# Patient Record
Sex: Female | Born: 1955
Health system: Southern US, Community
[De-identification: ages and names within clinical notes are randomized; demographics above are authoritative.]

## PROBLEM LIST (undated history)

## (undated) DIAGNOSIS — M1990 Unspecified osteoarthritis, unspecified site: Secondary | ICD-10-CM

## (undated) DIAGNOSIS — F419 Anxiety disorder, unspecified: Secondary | ICD-10-CM

## (undated) DIAGNOSIS — Z9889 Other specified postprocedural states: Secondary | ICD-10-CM

## (undated) DIAGNOSIS — M810 Age-related osteoporosis without current pathological fracture: Secondary | ICD-10-CM

## (undated) DIAGNOSIS — R112 Nausea with vomiting, unspecified: Secondary | ICD-10-CM

## (undated) DIAGNOSIS — K219 Gastro-esophageal reflux disease without esophagitis: Secondary | ICD-10-CM

## (undated) DIAGNOSIS — K589 Irritable bowel syndrome without diarrhea: Secondary | ICD-10-CM

## (undated) HISTORY — DX: Unspecified osteoarthritis, unspecified site: M19.90

## (undated) HISTORY — DX: Irritable bowel syndrome, unspecified: K58.9

## (undated) HISTORY — DX: Age-related osteoporosis without current pathological fracture: M81.0

## (undated) HISTORY — PX: BUNIONECTOMY: SHX129

---

## 2008-04-04 ENCOUNTER — Ambulatory Visit: Payer: Self-pay | Admitting: Family Medicine

## 2008-10-28 ENCOUNTER — Ambulatory Visit: Payer: Self-pay | Admitting: Family Medicine

## 2009-05-08 ENCOUNTER — Ambulatory Visit: Payer: Self-pay | Admitting: Gastroenterology

## 2011-01-10 ENCOUNTER — Ambulatory Visit: Payer: Self-pay | Admitting: Podiatry

## 2011-09-03 ENCOUNTER — Ambulatory Visit: Payer: Self-pay | Admitting: Internal Medicine

## 2011-09-18 ENCOUNTER — Ambulatory Visit: Payer: Self-pay | Admitting: Internal Medicine

## 2011-10-17 ENCOUNTER — Ambulatory Visit: Payer: Self-pay | Admitting: Internal Medicine

## 2012-01-30 ENCOUNTER — Ambulatory Visit: Payer: Self-pay | Admitting: Podiatry

## 2014-07-20 ENCOUNTER — Encounter: Payer: Self-pay | Admitting: Family Medicine

## 2014-07-20 ENCOUNTER — Ambulatory Visit (INDEPENDENT_AMBULATORY_CARE_PROVIDER_SITE_OTHER): Payer: BC Managed Care – PPO | Admitting: Family Medicine

## 2014-07-20 VITALS — BP 118/70 | HR 66 | Temp 98.2°F | Resp 16 | Ht 62.25 in | Wt 102.4 lb

## 2014-07-20 DIAGNOSIS — R634 Abnormal weight loss: Secondary | ICD-10-CM

## 2014-07-20 DIAGNOSIS — H6120 Impacted cerumen, unspecified ear: Secondary | ICD-10-CM

## 2014-07-20 DIAGNOSIS — H6121 Impacted cerumen, right ear: Secondary | ICD-10-CM

## 2014-07-20 DIAGNOSIS — R1013 Epigastric pain: Secondary | ICD-10-CM

## 2014-07-20 DIAGNOSIS — K5900 Constipation, unspecified: Secondary | ICD-10-CM

## 2014-07-20 DIAGNOSIS — R110 Nausea: Secondary | ICD-10-CM

## 2014-07-20 LAB — CBC WITH DIFFERENTIAL/PLATELET
BASOS ABS: 0 10*3/uL (ref 0.0–0.1)
BASOS PCT: 0 % (ref 0–1)
EOS ABS: 0.1 10*3/uL (ref 0.0–0.7)
Eosinophils Relative: 1 % (ref 0–5)
HCT: 40.2 % (ref 36.0–46.0)
HEMOGLOBIN: 13.9 g/dL (ref 12.0–15.0)
Lymphocytes Relative: 21 % (ref 12–46)
Lymphs Abs: 1.6 10*3/uL (ref 0.7–4.0)
MCH: 31 pg (ref 26.0–34.0)
MCHC: 34.6 g/dL (ref 30.0–36.0)
MCV: 89.7 fL (ref 78.0–100.0)
MONOS PCT: 11 % (ref 3–12)
Monocytes Absolute: 0.8 10*3/uL (ref 0.1–1.0)
Neutro Abs: 5.2 10*3/uL (ref 1.7–7.7)
Neutrophils Relative %: 67 % (ref 43–77)
Platelets: 555 10*3/uL — ABNORMAL HIGH (ref 150–400)
RBC: 4.48 MIL/uL (ref 3.87–5.11)
RDW: 13.9 % (ref 11.5–15.5)
WBC: 7.7 10*3/uL (ref 4.0–10.5)

## 2014-07-20 LAB — POCT UA - MICROSCOPIC ONLY
Casts, Ur, LPF, POC: NEGATIVE
Crystals, Ur, HPF, POC: NEGATIVE
Epithelial cells, urine per micros: NEGATIVE
MUCUS UA: NEGATIVE
WBC, UR, HPF, POC: NEGATIVE
Yeast, UA: NEGATIVE

## 2014-07-20 LAB — POCT URINALYSIS DIPSTICK
BILIRUBIN UA: NEGATIVE
Glucose, UA: NEGATIVE
KETONES UA: NEGATIVE
Nitrite, UA: NEGATIVE
Protein, UA: 30
Spec Grav, UA: 1.015
Urobilinogen, UA: 0.2
pH, UA: 6

## 2014-07-20 MED ORDER — PANTOPRAZOLE SODIUM 40 MG PO TBEC: 40.0000 mg | DELAYED_RELEASE_TABLET | Freq: Every day | ORAL | Status: DC

## 2014-07-20 NOTE — Progress Notes (Signed)
Subjective:    Patient ID: Mary Kirk, female    DOB: August 16, 1956, 58 y.o.   MRN: [id number]  HPI Chief Complaint  Patient presents with  . Establish Care   This chart was scribed for Ethelda Chick, MD by Andrew Au, ED Scribe. This patient was seen in room 21 and the patient's care was started at 3:00 PM.  HPI Comments: Mary Kirk is a 58 y.o. female who presents to the [facility] to establish care. This is a former pt of Dr. Katrinka Blazing when she practiced in [place]. Pt is currently not working.  She c/o gradual intermittent nausea onset 2 months. She reports nausea is worse in the morning after eating. Pt reports running after eating in the morning and that nausea is subsided after exercise. She denies nausea while running. She reports she has a foul smell in ear that she suspects was an inner ear infection. She correlates this with nausea. She reports she washed ear with syringe. She denies nausea in the past 2 days. She report weight loss. She has a restricted diet due to trouble with constipation in the past which has improved. She reports she takes miralax as needed. She reports she does not eat carbs and eats foods with high fiber content. Pt's last colonoscopy was about 6 years ago which was normal. She reports a bitter taste in mouth at times in the morning. She denies emesis and abdominal pain. She denies CP.   She reports worsening right groin pain. She reports this worsens with constipation. Pt has had a pelvic ultrasound in the past for this problem but found nothing wrong with ovary.    Pt c/o arthritis. She reports achiness and pain in bilateral hands. She report she takes tylenol for this.  History reviewed. No pertinent past medical history. History reviewed. No pertinent past surgical history. Prior to Admission medications   Medication Sig Start Date End Date Taking? Authorizing Provider  OVER THE COUNTER MEDICATION daily.   Yes Historical  Provider, MD   History   Social History  . Marital Status: Married    Spouse Name: N/A    Number of Children: N/A  . Years of Education: N/A   Occupational History  . Not on file.   Social History Main Topics  . Smoking status: Never Smoker   . Smokeless tobacco: Not on file  . Alcohol Use: Yes     Comment: wine  . Drug Use: No  . Sexual Activity: Not on file   Other Topics Concern  . Not on file   Social History Narrative  . No narrative on file   No family history on file.   Review of Systems  Constitutional: Positive for activity change, appetite change and unexpected weight change. Negative for fever and chills.  Respiratory: Negative for cough and shortness of breath.   Cardiovascular: Negative for chest pain, palpitations and leg swelling.  Gastrointestinal: Positive for nausea and constipation. Negative for vomiting, abdominal pain, diarrhea, blood in stool, abdominal distention and anal bleeding.  Genitourinary: Positive for pelvic pain. Negative for dysuria, urgency and hematuria.  Skin: Negative for rash.  Psychiatric/Behavioral: Negative for dysphoric mood. The patient is not nervous/anxious.    Objective:   Physical Exam  Nursing note and vitals reviewed. Constitutional: She is oriented to person, place, and time. She appears well-developed and well-nourished. No distress.  HENT:  Head: Normocephalic and atraumatic.  Left Ear: External ear normal.  Nose: Nose  normal.  Mouth/Throat: Oropharynx is clear and moist.  Cerumen impaction in right ear.   Eyes: Conjunctivae and EOM are normal. Pupils are equal, round, and reactive to light.  Neck: Neck supple. No thyromegaly present.  Cardiovascular: Normal rate, regular rhythm and normal heart sounds.  Exam reveals no gallop and no friction rub.   No murmur heard. Pulmonary/Chest: Effort normal and breath sounds normal. No respiratory distress. She has no wheezes. She has no rales. She exhibits no tenderness.    Abdominal: Soft. Bowel sounds are normal. She exhibits no distension and no mass. There is no hepatosplenomegaly. There is tenderness ( mild) in the epigastric area. There is no rebound, no guarding and no CVA tenderness. No hernia.  Musculoskeletal: Normal range of motion.  Lymphadenopathy:    She has no cervical adenopathy.  Neurological: She is alert and oriented to person, place, and time.  Skin: Skin is warm and dry.  Psychiatric: She has a normal mood and affect. Her behavior is normal.   Filed Vitals:   07/20/14 1428  BP: 118/70  Pulse: 66  Temp: 98.2 F (36.8 C)  TempSrc: Oral  Resp: 16  Height: 5' 2.25" (1.581 m)  Weight: 102 lb 6.4 oz (46.448 kg)  SpO2: 99%   Assessment & Plan:   1. Nausea alone   2. Unspecified constipation   3. Abdominal pain, epigastric   4. Loss of weight   5. Cerumen impaction, right    1. Nausea:  New.  In early mornings; obtain labs. Treat with PPI for 1-3 months.  If persists after one month of PPI, refer to GI for EGD.   2. Constipation: worsening with change in activity; continue Miralax daily PRN; continue with exercise. 3.  Abdominal pain epigastric:  New. Associated with nausea. Ddx includes gastritis, PUD, pancreatic process; obtain labs. If no improvement in six weeks with PPI, refer to GI. 4.  Loss of weight:  New.  Associated with nausea, epigastric pain. If worsens, obtain CT abd/pelvis to evaluate for pancreatic cancer or intraabdominal process.   Refer to GI for EGD. 5. Cerumen impaction R: New.    I personally performed the services described in this documentation, which was scribed in my presence.  The recorded information has been reviewed and is accurate.  Nilda SimmerKristi Vittorio Mohs, M.D.  [facility]  [place] [address] [place], [place]  [id number] [phone] phone [phone] fax

## 2014-07-21 ENCOUNTER — Telehealth: Payer: Self-pay

## 2014-07-21 LAB — COMPREHENSIVE METABOLIC PANEL
ALK PHOS: 41 U/L (ref 39–117)
ALT: 31 U/L (ref 0–35)
AST: 25 U/L (ref 0–37)
Albumin: 4.4 g/dL (ref 3.5–5.2)
BILIRUBIN TOTAL: 0.7 mg/dL (ref 0.2–1.2)
BUN: 12 mg/dL (ref 6–23)
CO2: 27 mEq/L (ref 19–32)
Calcium: 9.7 mg/dL (ref 8.4–10.5)
Chloride: 101 mEq/L (ref 96–112)
Creat: 0.8 mg/dL (ref 0.50–1.10)
GLUCOSE: 74 mg/dL (ref 70–99)
Potassium: 5.1 mEq/L (ref 3.5–5.3)
SODIUM: 137 meq/L (ref 135–145)
Total Protein: 6.7 g/dL (ref 6.0–8.3)

## 2014-07-21 LAB — AMYLASE: Amylase: 121 U/L — ABNORMAL HIGH (ref 0–105)

## 2014-07-21 LAB — LIPASE: Lipase: 25 U/L (ref 0–75)

## 2014-07-21 LAB — TSH: TSH: 1.163 u[IU]/mL (ref 0.350–4.500)

## 2014-07-21 NOTE — Telephone Encounter (Signed)
SMITH - Pt says the medicine she was prescribed yesterday could lead to low B-12.  She already has low B-12 and takes a supplement.  Should she take this medicine?  [phone]

## 2014-07-21 NOTE — Telephone Encounter (Signed)
She was prescribed Protonix 40mg . Please advise.

## 2014-07-25 ENCOUNTER — Telehealth: Payer: Self-pay

## 2014-07-25 NOTE — Telephone Encounter (Signed)
Pt has stopped the Pantoprozole.  Please advise GI consult. I have pended order.

## 2014-07-25 NOTE — Telephone Encounter (Signed)
Patient says she is having side effects to medication prescribed for her stomach issue, she now wants to see [facility] if there are no other medications that she can take.  She says the medications prescribed makes her heart race and makes her dizzy, so she will no longer be taking it.

## 2014-07-26 MED ORDER — RANITIDINE HCL 150 MG PO TABS: 150.0000 mg | ORAL_TABLET | Freq: Two times a day (BID) | ORAL | Status: DC

## 2014-07-26 NOTE — Telephone Encounter (Signed)
Call back --- 1. Protonix/pantoprazole can lead to low b12 levels if taken long-term (i.e. For several months or years).  I do not plan on patient taking for several months; thus, I am not concerned about it causing a worsening B12 deficiency. Pt also complained of side effects to medication; thus, I have sent a new rx for Ranitidine bid to her pharmacy to start taking.

## 2014-07-26 NOTE — Telephone Encounter (Signed)
Pt has been advised. She will monitor for any side effects and call us with any concerns.

## 2014-07-26 NOTE — Telephone Encounter (Signed)
Call --- 1.  I would like to try a different medication for patient.  I have sent in Ranitidine 150mg  one tablet twice daily to pharmacy.

## 2014-07-26 NOTE — Telephone Encounter (Signed)
Advised pt- ok on holding off on the GI referral.

## 2014-08-02 ENCOUNTER — Encounter: Payer: Self-pay | Admitting: Family Medicine

## 2014-08-17 ENCOUNTER — Telehealth: Payer: Self-pay

## 2014-08-17 MED ORDER — ONDANSETRON HCL 8 MG PO TABS: 8.0000 mg | ORAL_TABLET | Freq: Three times a day (TID) | ORAL | Status: DC | PRN

## 2014-08-17 NOTE — Telephone Encounter (Signed)
Please advise on nausea medication.

## 2014-08-17 NOTE — Telephone Encounter (Signed)
Patient requesting medication to be called in to [facility] pharmacy [phone] for nausea. Per patient it is not coming from illness and the nausea comes and goes. Patient has an appointment with Dr Katrinka Blazing on sept 16 @ 10:15. Patients call back number is [phone]

## 2014-08-17 NOTE — Telephone Encounter (Signed)
Call --- rx for Zofran sent to pharmacy.

## 2014-08-18 NOTE — Telephone Encounter (Signed)
Pt advised.

## 2014-08-31 ENCOUNTER — Encounter: Payer: Self-pay | Admitting: Family Medicine

## 2014-08-31 ENCOUNTER — Ambulatory Visit (INDEPENDENT_AMBULATORY_CARE_PROVIDER_SITE_OTHER): Payer: BC Managed Care – PPO | Admitting: Family Medicine

## 2014-08-31 ENCOUNTER — Other Ambulatory Visit: Payer: Self-pay | Admitting: Family Medicine

## 2014-08-31 VITALS — BP 129/76 | HR 67 | Temp 98.1°F | Resp 16 | Ht 64.5 in | Wt 101.0 lb

## 2014-08-31 DIAGNOSIS — R1013 Epigastric pain: Secondary | ICD-10-CM

## 2014-08-31 DIAGNOSIS — Z23 Encounter for immunization: Secondary | ICD-10-CM

## 2014-08-31 DIAGNOSIS — R110 Nausea: Secondary | ICD-10-CM

## 2014-08-31 DIAGNOSIS — R634 Abnormal weight loss: Secondary | ICD-10-CM

## 2014-08-31 DIAGNOSIS — F411 Generalized anxiety disorder: Secondary | ICD-10-CM

## 2014-08-31 DIAGNOSIS — R748 Abnormal levels of other serum enzymes: Secondary | ICD-10-CM

## 2014-08-31 LAB — COMPLETE METABOLIC PANEL WITH GFR
ALBUMIN: 4.5 g/dL (ref 3.5–5.2)
ALT: 40 U/L — AB (ref 0–35)
AST: 33 U/L (ref 0–37)
Alkaline Phosphatase: 47 U/L (ref 39–117)
BUN: 13 mg/dL (ref 6–23)
CHLORIDE: 99 meq/L (ref 96–112)
CO2: 28 meq/L (ref 19–32)
Calcium: 9.8 mg/dL (ref 8.4–10.5)
Creat: 0.78 mg/dL (ref 0.50–1.10)
GFR, EST NON AFRICAN AMERICAN: 84 mL/min
GLUCOSE: 94 mg/dL (ref 70–99)
POTASSIUM: 4.9 meq/L (ref 3.5–5.3)
SODIUM: 134 meq/L — AB (ref 135–145)
TOTAL PROTEIN: 6.8 g/dL (ref 6.0–8.3)
Total Bilirubin: 0.8 mg/dL (ref 0.2–1.2)

## 2014-08-31 LAB — AMYLASE: AMYLASE: 154 U/L — AB (ref 0–105)

## 2014-08-31 LAB — LIPASE: Lipase: 36 U/L (ref 0–75)

## 2014-08-31 NOTE — Patient Instructions (Signed)
1.  Call if your nausea recurs. 2. Also call if you feel that your anxiety is worsening.

## 2014-08-31 NOTE — Progress Notes (Signed)
Subjective:    Patient ID: Mary Kirk, female    DOB: [birth year], 58 y.o.   MRN: [id number]  This chart was scribed for Mary Chick, MD by Tonye Royalty, ED Scribe. This patient was seen in room 22 and the patient's care was started at 10:33 AM.   08/31/2014  follow up visit, Nausea, Abdominal Pain and Weight Loss   HPI HPI Comments: Mary Kirk is a 58 y.o. female who presents to the [facility] for 6 week follow up for nausea, epigastric pain, and weight loss. Labs were obtained at her last visit, her CBC was normal except for platelets that were slightly elevated which is a chronic issue, sugar was normal, liver and kidney functions were normal, thyroid was normal, lipase was normal but had slightly elevated amylase and I recommended repeating that at this visit. I prescribed Protonix and she suffered some side effects; she was worried about worsening B12 deficiency if she continued, so I switched her to Zantac. She called 2 weeks ago requesting medication for nausea which she did not want at the initial visit so I sent Zofran. Her weight is down 1 lb since 6 weeks ago.  She states her symptoms are intermittent and are better some days and worse some days; she states she has been well for the past 6 days. Before that, she was on vacation in [place]. She reports using Zantac twice a day for a month without noticeably improved symptoms. She states she has not used the Zofran. She states symptoms seemed to be worse after eating breakfast, usually eggs and toast, but that has not been consistent lately. She reports running but states symptoms do not seem to be significantly correlated. She states symptoms seem to occur when she is busy and she reports a history of undiagnosed anxiety. She states her anxiety seems worse since she has not been working and reports recent stress [place]. She denies anxiety ever having caused similar symptoms before.  She states she drinks less coffee now since she stopped working. She reports drinking ginger tea without remission of symptoms. She reports in the past having some nausea with tiredness with associated neck tightness and reports nausea eases after massaging her neck. She reports chronic constipation. She reports moderate alcohol use and family history of alcoholism but states she is mindful of her alcohol intake.  She drinks one glass of wine per week night and 2-3 glasses per weekend night.   Review of Systems  Constitutional: Positive for fatigue and unexpected weight change (loss). Negative for fever, chills and diaphoresis.  Respiratory: Negative for shortness of breath.   Cardiovascular: Negative for palpitations and leg swelling.  Gastrointestinal: Positive for nausea and constipation (chronic). Negative for abdominal pain, diarrhea, blood in stool, abdominal distention, anal bleeding and rectal pain.       Abdominal discomfort  Endocrine: Negative for cold intolerance, heat intolerance, polydipsia, polyphagia and polyuria.  Psychiatric/Behavioral: The patient is nervous/anxious.        Anxiety    History reviewed. No pertinent past medical history. History reviewed. No pertinent past surgical history. Allergies  Allergen Reactions  . Codeine Nausea Only   Current Outpatient Prescriptions  Medication Sig Dispense Refill  . OVER THE COUNTER MEDICATION daily.      . ondansetron (ZOFRAN) 8 MG tablet Take 1 tablet (8 mg total) by mouth every 8 (eight) hours as needed for nausea or vomiting.  20 tablet  0  . pantoprazole (PROTONIX) 40 MG tablet Take 1 tablet (40 mg total) by mouth daily.  30 tablet  3  . ranitidine (ZANTAC) 150 MG tablet Take 1 tablet (150 mg total) by mouth 2 (two) times daily.  60 tablet  3   No current facility-administered medications for this visit.       Objective:    BP 129/76  Pulse 67  Temp(Src) 98.1 F (36.7 C) (Oral)  Resp 16  Ht 5' 4.5" (1.638 m)   Wt 101 lb (45.813 kg)  BMI 17.08 kg/m2  SpO2 99% Physical Exam  Nursing note and vitals reviewed. Constitutional: She is oriented to person, place, and time. She appears well-developed and well-nourished. No distress.  HENT:  Head: Normocephalic and atraumatic.  Right Ear: External ear normal.  Left Ear: External ear normal.  Nose: Nose normal.  Mouth/Throat: Oropharynx is clear and moist.  Eyes: Conjunctivae and EOM are normal. Pupils are equal, round, and reactive to light.  Neck: Normal range of motion. Neck supple. Carotid bruit is not present. No thyromegaly present.  Cardiovascular: Normal rate, regular rhythm, normal heart sounds and intact distal pulses.  Exam reveals no gallop and no friction rub.   No murmur heard. Pulmonary/Chest: Effort normal and breath sounds normal. No respiratory distress. She has no wheezes. She has no rales.  Abdominal: Soft. Bowel sounds are normal. She exhibits no distension and no mass. There is tenderness (epigastric). There is no rebound and no guarding.  Musculoskeletal: Normal range of motion.  Lymphadenopathy:    She has no cervical adenopathy.  Neurological: She is alert and oriented to person, place, and time. No cranial nerve deficit.  Skin: Skin is warm and dry. No rash noted. She is not diaphoretic. No erythema. No pallor.  Psychiatric: She has a normal mood and affect. Her behavior is normal.   Results for orders placed in visit on 08/31/14  LIPASE      Result Value Ref Range   Lipase 36  0 - 75 U/L  AMYLASE      Result Value Ref Range   Amylase 154 (*) 0 - 105 U/L  COMPLETE METABOLIC PANEL WITH GFR      Result Value Ref Range   Sodium 134 (*) 135 - 145 mEq/L   Potassium 4.9  3.5 - 5.3 mEq/L   Chloride 99  96 - 112 mEq/L   CO2 28  19 - 32 mEq/L   Glucose, Bld 94  70 - 99 mg/dL   BUN 13  6 - 23 mg/dL   Creat 1.61  0.96 - 0.45 mg/dL   Total Bilirubin 0.8  0.2 - 1.2 mg/dL   Alkaline Phosphatase 47  39 - 117 U/L   AST 33  0 - 37  U/L   ALT 40 (*) 0 - 35 U/L   Total Protein 6.8  6.0 - 8.3 g/dL   Albumin 4.5  3.5 - 5.2 g/dL   Calcium 9.8  8.4 - 40.9 mg/dL   GFR, Est African American >89     GFR, Est Non African American 84     INFLUENZA VACCINE ADMINISTERED.    Assessment & Plan:   1. Nausea alone   2. Abdominal pain, epigastric   3. Elevated amylase   4. Anxiety state, unspecified   5. Need for prophylactic vaccination and inoculation against influenza   6. Loss of weight     1. Nausea: intermittent but persistent; improvement in past week.  No improvement with  Zantac bid for one month. I recurs, refer for CT abd/pelvis and for GI consultation.   2.  Abdominal pain epigastric: persistent but mild; associated with nausea; if persists, refer to GI. 3  Elevated amylase:  New at last visit; repeat today and remains elevated; warrants CT abd/pelvis and GI consultation. 4.  Anxiety: worsening lately due to transition to not working and moving.  Pt reluctant to try PRN anxiety medication or daily anxiety medication; if persists or worsens, agreeable to trial of medication.  Very reluctant to take habit forming benzo. 5.  S/p flu vaccine. 6. Weight loss: stable; weight down only one pound in six weeks; if worsens, will warrant appetite stimulant and further work up.   No orders of the defined types were placed in this encounter.    No Follow-up on file.   I personally performed the services described in this documentation, which was scribed in my presence.  The recorded information has been reviewed and is accurate.   Nilda Simmer, M.D.  [facility] [address] [place], [place]  [address] [phone] phone [phone] fax

## 2014-09-06 NOTE — Addendum Note (Signed)
Addended by: Ethelda Chick on: 09/06/2014 03:14 PM   Modules accepted: Orders

## 2014-09-07 ENCOUNTER — Encounter: Payer: Self-pay | Admitting: Family Medicine

## 2014-09-07 LAB — HEPATITIS PANEL, ACUTE
HCV Ab: NEGATIVE
HEP B C IGM: NONREACTIVE
Hep A IgM: NONREACTIVE
Hepatitis B Surface Ag: NEGATIVE

## 2014-09-14 ENCOUNTER — Telehealth: Payer: Self-pay

## 2014-09-15 ENCOUNTER — Ambulatory Visit: Payer: Self-pay | Admitting: Family Medicine

## 2014-09-22 ENCOUNTER — Encounter: Payer: Self-pay | Admitting: Family Medicine

## 2014-09-22 ENCOUNTER — Telehealth: Payer: Self-pay | Admitting: Family Medicine

## 2014-09-22 DIAGNOSIS — K862 Cyst of pancreas: Secondary | ICD-10-CM

## 2014-09-22 NOTE — Telephone Encounter (Signed)
Spoke with patient --- advised of CT abdomen and pelvis results:  6 mm low density lesion within pancreatic tail which is likely cystic but cannot rule out indolent cystic neoplasm.  Recommend MRI without and with contrast in one year; however, I advised obtaining MRI pancreas in six months to patient.  I will also fax to Dr. [name] Wohl in [place].  Pt expressed understanding.

## 2014-09-22 NOTE — Telephone Encounter (Signed)
Patient called requesting CT results. Report is in your box

## 2014-09-23 ENCOUNTER — Encounter: Payer: Self-pay | Admitting: Family Medicine

## 2014-10-11 ENCOUNTER — Encounter: Payer: Self-pay | Admitting: Family Medicine

## 2014-12-26 ENCOUNTER — Ambulatory Visit (INDEPENDENT_AMBULATORY_CARE_PROVIDER_SITE_OTHER): Payer: BLUE CROSS/BLUE SHIELD | Admitting: Family Medicine

## 2014-12-26 ENCOUNTER — Encounter: Payer: Self-pay | Admitting: Family Medicine

## 2014-12-26 VITALS — BP 124/70 | HR 68 | Temp 97.9°F | Resp 16 | Ht 63.75 in | Wt 103.0 lb

## 2014-12-26 DIAGNOSIS — S39012A Strain of muscle, fascia and tendon of lower back, initial encounter: Secondary | ICD-10-CM

## 2014-12-26 MED ORDER — CYCLOBENZAPRINE HCL 10 MG PO TABS: ORAL_TABLET | ORAL | Status: DC

## 2014-12-26 NOTE — Progress Notes (Signed)
   Subjective:    Patient ID: Mary Kirk, female    DOB: 10/23/1956, 59 y.o.   MRN: [id number]  HPI Patient presents today with 3 day history of back pain. She has had a sensation of her back "going out," in the past. This usually occurs every couple of years and resolves within a day or two with acetaminophen.  Three days ago, she moved a couple of chairs and noticed pain in her low back, worse on the right side. She has had more "grabbing" pain with this episode. She felt better yesterday, but today has more pain. Worse after sitting or lying down for long periods of time. Slept poorly the first night and has slept better the last two nights.  Has taken arthritis strength tylenol without good relief. Using Icy hot patches with some relief.   She has not fallen. She has not had loss of bowel/bladder. No numbness, no tingling, no weakness.   She typically runs for exercise. Does not stretch often or do core strengthening exercises. Has not run since her back started hurting.   Review of Systems  Constitutional: Negative for fever and chills.  Respiratory: Negative for cough and shortness of breath.   Cardiovascular: Negative for chest pain.  Genitourinary: Negative for difficulty urinating.  Musculoskeletal: Positive for back pain. Negative for gait problem, neck pain and neck stiffness.  Neurological: Negative for dizziness, weakness, numbness and headaches.      Objective:   Physical Exam  Constitutional: She is oriented to person, place, and time. She appears well-developed and well-nourished.  HENT:  Head: Normocephalic and atraumatic.  Eyes: Conjunctivae are normal. Pupils are equal, round, and reactive to light.  Neck: Normal range of motion. Neck supple.  Cardiovascular: Normal rate, regular rhythm and normal heart sounds.   Pulmonary/Chest: Effort normal and breath sounds normal.  Musculoskeletal: Normal range of motion. She exhibits no edema or tenderness.       Lumbar  back: She exhibits pain. She exhibits normal range of motion and no bony tenderness. Tenderness: slightly tender right of midline.  Neurological: She is alert and oriented to person, place, and time. She has normal reflexes.  Skin: Skin is warm and dry.  Psychiatric: She has a normal mood and affect. Her behavior is normal. Judgment and thought content normal.  Vitals reviewed. BP 124/70 mmHg  Pulse 68  Temp(Src) 97.9 F (36.6 C) (Oral)  Resp 16  Ht 5' 3.75" (1.619 m)  Wt 103 lb (46.72 kg)  BMI 17.82 kg/m2  SpO2 96%     Assessment & Plan:  1. Low back strain, initial encounter - cyclobenzaprine (FLEXERIL) 10 MG tablet; Take 1/2- 1 tablet every 12 hours as needed for muscle spasm. Do not drive or operate heavy machinery while taking.  Dispense: 20 tablet; Refill: 0 - try otc NSAID for next several days for pain - Back Manual given to patient and she was encouraged to incorporate stretching and core strengthening into her regimen.  -RTC if no improvement in 5-7 days, sooner if worsening symptoms or if weakness, numbness/tingling, falls, loss of bowel/bladder function.   Emi Belfasteborah B. Coran Dipaola, FNP-BC  [facility], [facility]  12/27/2014 1:04 PM

## 2014-12-26 NOTE — Patient Instructions (Signed)
Try ibuprofen/advil 2-3 tablets every 8 hours for inflammation and pain OR naprosyn/alleve 1-2 tablets every 12 hours Return to clinic if pain worsens, or you have numbness/tingling/weakness in arms or legs.

## 2015-03-27 ENCOUNTER — Ambulatory Visit
Admit: 2015-03-27 | Disposition: A | Discharge status: Home / Self Care | Payer: Self-pay | Attending: Gastroenterology | Admitting: Gastroenterology

## 2015-05-22 ENCOUNTER — Encounter: Payer: Self-pay | Admitting: *Deleted

## 2015-11-15 ENCOUNTER — Ambulatory Visit (INDEPENDENT_AMBULATORY_CARE_PROVIDER_SITE_OTHER): Payer: BLUE CROSS/BLUE SHIELD | Admitting: Family Medicine

## 2015-11-15 VITALS — BP 110/70 | HR 64 | Temp 98.4°F | Resp 18 | Ht 65.0 in | Wt 105.3 lb

## 2015-11-15 DIAGNOSIS — K5901 Slow transit constipation: Secondary | ICD-10-CM

## 2015-11-15 DIAGNOSIS — Z23 Encounter for immunization: Secondary | ICD-10-CM

## 2015-11-15 MED ORDER — LUBIPROSTONE 8 MCG PO CAPS: 8.0000 ug | ORAL_CAPSULE | Freq: Two times a day (BID) | ORAL | Status: DC

## 2015-11-15 NOTE — Patient Instructions (Signed)
Options: 1. Miralax once daily to twice daily.  2.  The week prior to a trip, increase Miralax to twice daily and also start Peri-colace 1-2 tablets twice daily during trip.  Option 2: 1. Amitiza twice daily.   Lubiprostone oral capsule What is this medicine? LUBIPROSTONE (loo bi PROS tone) is a laxative. It is used to treat chronic constipation and constipation caused by opioids (certain prescription pain medicines). It is also used to treat adult women with irritable bowel syndrome who have constipation. This medicine may be used for other purposes; ask your health care provider or pharmacist if you have questions. What should I tell my health care provider before I take this medicine? They need to know if you have any of these conditions: -cancer or tumor in abdomen, intestine, or stomach -history of bowel obstruction or adhesions -history of stool (fecal) impaction -liver disease -an unusual or allergic reaction to lubiprostone, other medicines, foods, dyes, or preservatives -pregnant or trying to get pregnant -breast-feeding How should I use this medicine? Take this medicine by mouth with a glass of water. Follow the directions on the prescription label. Do not cut, crush or chew this medicine. Take this medicine with food. Take your medicine at regular intervals. Do not take your medicine more often than directed. Do not stop taking except on your doctor's advice. Talk to your pediatrician regarding the use of this medicine in children. Special care may be needed. Overdosage: If you think you have taken too much of this medicine contact a poison control center or emergency room at once. NOTE: This medicine is only for you. Do not share this medicine with others. What if I miss a dose? If you miss a dose, take it as soon as you can. If it is almost time for your next dose, take only that dose. Do not take double or extra doses. What may interact with this medicine? -medicines that  treat diarrhea -methadone -other medicines for constipation This list may not describe all possible interactions. Give your health care provider a list of all the medicines, herbs, non-prescription drugs, or dietary supplements you use. Also tell them if you smoke, drink alcohol, or use illegal drugs. Some items may interact with your medicine. What should I watch for while using this medicine? Visit your doctor for regular check ups. Tell your doctor if your symptoms do not get better or if they get worse. What side effects may I notice from receiving this medicine? Side effects that you should report to your doctor or health care professional as soon as possible: -allergic reactions like skin rash, itching or hives, swelling of the face, lips, or tongue -new or worsening stomach pain -severe or prolonged diarrhea Side effects that usually do not require medical attention (report to your prescriber or health care professional if they continue or are bothersome): -headache -loose stools -nausea This list may not describe all possible side effects. Call your doctor for medical advice about side effects. You may report side effects to FDA at 1-800-FDA-1088. Where should I keep my medicine? Keep out of the reach of children. Store at room temperature between 15 and 30 degrees C (59 and 86 degrees F). Throw away any unused medicine after the expiration date. NOTE: This sheet is a summary. It may not cover all possible information. If you have questions about this medicine, talk to your doctor, pharmacist, or health care provider.    2016, Elsevier/Gold Standard. (2012-04-10 09:24:31)

## 2015-11-15 NOTE — Progress Notes (Signed)
Subjective:    Patient ID: Mary Kirk, female    DOB: 01/30/1956, 59 y.o.   MRN: [id number]  11/15/2015  Constipation   HPI This 59 y.o. female presents for evaluation of constipation.  Chronic issue for patient.  As long as patient runs, stools are regular. Now, exercising is not helping as much as previous.  Now if misses one day of running, will not have b.m.  Most recently went on vacation in October; avoided cheese and potatoes.  Riding cauess constipation; drank excessive water.  Took Miralax due to traveling.  Still didn't have b.m. During vacation.  Just went this weekend to mountains.  Did not skip running on trip.  Progressively worsening.  Goes to dinner and has vegetables with meat.  Still not enough.  This time, took Dulcolax but it worked.  Tries not to take laxatives; had not taken in a long time.  Sensation that had to go.  Used maximum dose of Dolcolax.  Then has diarrhea.  Disrupted day.  Runs to avoid this.  No previous diagnosis of IBS but has always suffered with constipation.  In past, will have constipation alternating with diarrhea.  Discussed with Dr. Servando SnareWohl; no recent issues in 6-8 months.  Has never took 1.5 doses of Miralax one day.  RLQ pain intermittently; if gets constipated, it gets worse.  [facility]   Did take Miralax daily with good results in past.  Has been taking Magnesium Citrate 200mg  once daily; now not helping.     Review of Systems  Constitutional: Negative for fever, chills, diaphoresis and fatigue.  Eyes: Negative for visual disturbance.  Respiratory: Negative for cough and shortness of breath.   Cardiovascular: Negative for chest pain, palpitations and leg swelling.  Gastrointestinal: Positive for nausea and constipation. Negative for vomiting, abdominal pain, diarrhea, blood in stool, abdominal distention, anal bleeding and rectal pain.  Endocrine: Negative for cold intolerance, heat intolerance, polydipsia, polyphagia and polyuria.   Genitourinary: Negative for dysuria, urgency, hematuria and flank pain.  Neurological: Negative for dizziness, tremors, seizures, syncope, facial asymmetry, speech difficulty, weakness, light-headedness, numbness and headaches.    History reviewed. No pertinent past medical history. History reviewed. No pertinent past surgical history. Allergies  Allergen Reactions  . Codeine Nausea Only   Current Outpatient Prescriptions  Medication Sig Dispense Refill  . cyclobenzaprine (FLEXERIL) 10 MG tablet Take 1/2- 1 tablet every 12 hours as needed for muscle spasm. Do not drive or operate heavy machinery while taking. (Patient not taking: Reported on 11/15/2015) 20 tablet 0  . lubiprostone (AMITIZA) 8 MCG capsule Take 1 capsule (8 mcg total) by mouth 2 (two) times daily with a meal. 60 capsule 5  . OVER THE COUNTER MEDICATION daily.    . pantoprazole (PROTONIX) 40 MG tablet Take 1 tablet (40 mg total) by mouth daily. (Patient not taking: Reported on 11/15/2015) 30 tablet 3  . ranitidine (ZANTAC) 150 MG tablet Take 1 tablet (150 mg total) by mouth 2 (two) times daily. (Patient not taking: Reported on 11/15/2015) 60 tablet 3   No current facility-administered medications for this visit.   Social History   Social History  . Marital Status: Married    Spouse Name: N/A  . Number of Children: N/A  . Years of Education: N/A   Occupational History  . unemployed     previous bank work   Social History Main Topics  . Smoking status: Never Smoker   . Smokeless tobacco: Never Used  .  Alcohol Use: 6.0 oz/week    10 Glasses of wine per week     Comment: wine  . Drug Use: No  . Sexual Activity: Yes   Other Topics Concern  . Not on file   Social History Narrative   Marital status: married     Children: yes      Lives: with husband      Employment: unemployed; previous bank work.      Tobacco; never      Alcohol: 1-2 glasses of wine per night      Exercise: jogging.   Family History    Problem Relation Age of Onset  . Diabetes Mother   . Heart disease Mother   . Arthritis Father   . Hyperlipidemia Father   . Hypertension Father   . Stroke Father 74    CVA at age 61; recurrent CVA age 71.       Objective:    BP 110/70 mmHg  Pulse 64  Temp(Src) 98.4 F (36.9 C) (Oral)  Resp 18  Ht  (1.651 m)  Wt 105 lb 5.4 oz (47.782 kg)  BMI 17.53 kg/m2  SpO2 98% Physical Exam  Constitutional: She is oriented to person, place, and time. She appears well-developed and well-nourished. No distress.  HENT:  Head: Normocephalic and atraumatic.  Nose: Nose normal.  Mouth/Throat: Oropharynx is clear and moist.  Eyes: Conjunctivae and EOM are normal. Pupils are equal, round, and reactive to light.  Neck: Normal range of motion. Neck supple. Carotid bruit is not present. No thyromegaly present.  Cardiovascular: Normal rate, regular rhythm, normal heart sounds and intact distal pulses.  Exam reveals no gallop and no friction rub.   No murmur heard. Pulmonary/Chest: Effort normal and breath sounds normal. She has no wheezes. She has no rales.  Abdominal: Soft. Bowel sounds are normal. She exhibits no distension and no mass. There is no tenderness. There is no rebound and no guarding.  Lymphadenopathy:    She has no cervical adenopathy.  Neurological: She is alert and oriented to person, place, and time. No cranial nerve deficit.  Skin: Skin is warm and dry. No rash noted. She is not diaphoretic. No erythema. No pallor.  Psychiatric: She has a normal mood and affect. Her behavior is normal.        Assessment & Plan:   1. Slow transit constipation   2. Need for prophylactic vaccination and inoculation against influenza    1.  Constipation chronic:  Recurrent; recommend trial of daily Miralax once daily to twice daily; prior to trips, recommend increasing Miralax to bid for one week prior and then adding Pericolace 1-2 tablets bid during trip. If no improvement with this  regimen, recommend trial of Amitiza  bid; rx provided.  If no improvement with Amitiza  bid, consider increase to  Amitiza bid. Colonoscopy UTD. 2.  S/p influenza vaccine.   Orders Placed This Encounter  Procedures  . Flu Vaccine QUAD 36+ mos IM   Meds ordered this encounter  Medications  . lubiprostone (AMITIZA) 8 MCG capsule    Sig: Take 1 capsule (8 mcg total) by mouth 2 (two) times daily with a meal.    Dispense:  60 capsule    Refill:  5    No Follow-up on file.    Joaovictor Krone Paulita Fujita, M.D. [facility] [address] [place], [place]  [address] [phone] phone [phone] fax

## 2015-12-13 ENCOUNTER — Encounter: Payer: Self-pay | Admitting: Family Medicine

## 2015-12-13 MED ORDER — LUBIPROSTONE 24 MCG PO CAPS: 24.0000 ug | ORAL_CAPSULE | Freq: Two times a day (BID) | ORAL | Status: DC

## 2016-04-01 ENCOUNTER — Ambulatory Visit (INDEPENDENT_AMBULATORY_CARE_PROVIDER_SITE_OTHER): Payer: BLUE CROSS/BLUE SHIELD | Admitting: Family Medicine

## 2016-04-01 VITALS — BP 112/78 | HR 71 | Temp 98.0°F | Resp 18 | Ht 65.0 in | Wt 104.6 lb

## 2016-04-01 DIAGNOSIS — K5901 Slow transit constipation: Secondary | ICD-10-CM

## 2016-04-01 DIAGNOSIS — L821 Other seborrheic keratosis: Secondary | ICD-10-CM

## 2016-04-01 DIAGNOSIS — B029 Zoster without complications: Secondary | ICD-10-CM

## 2016-04-01 MED ORDER — GABAPENTIN 100 MG PO CAPS: 100.0000 mg | ORAL_CAPSULE | Freq: Three times a day (TID) | ORAL | Status: DC

## 2016-04-01 MED ORDER — VALACYCLOVIR HCL 1 G PO TABS: 1000.0000 mg | ORAL_TABLET | Freq: Three times a day (TID) | ORAL | Status: DC

## 2016-04-01 NOTE — Patient Instructions (Addendum)
1. Lidocaine/lidoderm patches --- you can purchase over the counter and apply to area where pain is located.     IF you received an x-ray today, you will receive an invoice from [facility]. Please contact [facility] at [phone] with questions or concerns regarding your invoice.   IF you received labwork today, you will receive an invoice from [employer]. Please contact [facility] at [phone] with questions or concerns regarding your invoice.   Our billing staff will not be able to assist you with questions regarding bills from these companies.  You will be contacted with the lab results as soon as they are available. The fastest way to get your results is to activate your My Chart account. Instructions are located on the last page of this paperwork. If you have not heard from [place] regarding the results in 2 weeks, please contact this office.     Shingles Shingles, which is also known as herpes zoster, is an infection that causes a painful skin rash and fluid-filled blisters. Shingles is not related to genital herpes, which is a sexually transmitted infection.   Shingles only develops in people who:  Have had chickenpox.  Have received the chickenpox vaccine. (This is rare.) CAUSES Shingles is caused by varicella-zoster virus (VZV). This is the same virus that causes chickenpox. After exposure to VZV, the virus stays in the body in an inactive (dormant) state. Shingles develops if the virus reactivates. This can happen many years after the initial exposure to VZV. It is not known what causes this virus to reactivate. RISK FACTORS People who have had chickenpox or received the chickenpox vaccine are at risk for shingles. Infection is more common in people who:  Are older than age 60.  Have a weakened defense (immune) system, such as those with HIV, AIDS, or cancer.  Are taking medicines that weaken the immune system, such as transplant  medicines.  Are under great stress. SYMPTOMS Early symptoms of this condition include itching, tingling, and pain in an area on your skin. Pain may be described as burning, stabbing, or throbbing. A few days or weeks after symptoms start, a painful red rash appears, usually on one side of the body in a bandlike or beltlike pattern. The rash eventually turns into fluid-filled blisters that break open, scab over, and dry up in about 2-3 weeks. At any time during the infection, you may also develop:  A fever.  Chills.  A headache.  An upset stomach. DIAGNOSIS This condition is diagnosed with a skin exam. Sometimes, skin or fluid samples are taken from the blisters before a diagnosis is made. These samples are examined under a microscope or sent to a lab for testing. TREATMENT There is no specific cure for this condition. Your health care provider will probably prescribe medicines to help you manage pain, recover more quickly, and avoid long-term problems. Medicines may include:  Antiviral drugs.  Anti-inflammatory drugs.  Pain medicines. If the area involved is on your face, you may be referred to a specialist, such as an eye doctor (ophthalmologist) or an ear, nose, and throat (ENT) doctor to help you avoid eye problems, chronic pain, or disability. HOME CARE INSTRUCTIONS Medicines  Take medicines only as directed by your health care provider.  Apply an anti-itch or numbing cream to the affected area as directed by your health care provider. Blister and Rash Care  Take a cool bath or apply cool compresses to the area of the rash or blisters as  directed by your health care provider. This may help with pain and itching.  Keep your rash covered with a loose bandage (dressing). Wear loose-fitting clothing to help ease the pain of material rubbing against the rash.  Keep your rash and blisters clean with mild soap and cool water or as directed by your health care provider.  Check  your rash every day for signs of infection. These include redness, swelling, and pain that lasts or increases.  Do not pick your blisters.  Do not scratch your rash. General Instructions  Rest as directed by your health care provider.  Keep all follow-up visits as directed by your health care provider. This is important.  Until your blisters scab over, your infection can cause chickenpox in people who have never had it or been vaccinated against it. To prevent this from happening, avoid contact with other people, especially:  Babies.  Pregnant women.  Children who have eczema.  Elderly people who have transplants.  People who have chronic illnesses, such as leukemia or AIDS. SEEK MEDICAL CARE IF:  Your pain is not relieved with prescribed medicines.  Your pain does not get better after the rash heals.  Your rash looks infected. Signs of infection include redness, swelling, and pain that lasts or increases. SEEK IMMEDIATE MEDICAL CARE IF:  The rash is on your face or nose.  You have facial pain, pain around your eye area, or loss of feeling on one side of your face.  You have ear pain or you have ringing in your ear.  You have loss of taste.  Your condition gets worse.   This information is not intended to replace advice given to you by your health care provider. Make sure you discuss any questions you have with your health care provider.   Document Released: 12/02/2005 Document Revised: 12/23/2014 Document Reviewed: 10/13/2014 Elsevier Interactive Patient Education [employer]2016 Elsevier Inc.

## 2016-04-13 ENCOUNTER — Encounter: Payer: Self-pay | Admitting: Family Medicine

## 2016-04-13 NOTE — Progress Notes (Signed)
Subjective:    Patient ID: Mary Kirk, female    DOB: [birth year], 60 y.o.   MRN: [id number]  04/01/2016  Rash and Mole   HPI This 60 y.o. female presents for evaluation of rash on L buttocks; onset two days ago. Noticed significant pain and tingling at site initially; then noticed rash.  Concerned about shingles.  Also has a few moles that wants evaluated; followed closely by dermatology; will be due to see dermatology in upcoming 3 months.  Constipation: stable; will utilize Miralax with good results.   Review of Systems  Constitutional: Negative for fever, chills, diaphoresis and fatigue.  Musculoskeletal: Positive for myalgias.  Skin: Positive for rash.  Neurological: Positive for numbness.    Past Medical History  Diagnosis Date  . IBS (irritable bowel syndrome)     Constipation predominant   History reviewed. No pertinent past surgical history. Allergies  Allergen Reactions  . Codeine Nausea Only    Social History   Social History  . Marital Status: Married    Spouse Name: N/A  . Number of Children: N/A  . Years of Education: N/A   Occupational History  . unemployed     previous bank work   Social History Main Topics  . Smoking status: Never Smoker   . Smokeless tobacco: Never Used  . Alcohol Use: 6.0 oz/week    10 Glasses of wine per week     Comment: wine  . Drug Use: No  . Sexual Activity: Yes   Other Topics Concern  . Not on file   Social History Narrative   Marital status: married     Children: yes      Lives: with husband      Employment: unemployed; previous bank work.      Tobacco; never      Alcohol: 1-2 glasses of wine per night      Exercise: jogging.   Family History  Problem Relation Age of Onset  . Diabetes Mother   . Heart disease Mother   . Arthritis Father   . Hyperlipidemia Father   . Hypertension Father   . Stroke Father 67    CVA at age 61; recurrent CVA age 39.       Objective:    BP 112/78 mmHg  Pulse  71  Temp(Src) 98 F (36.7 C) (Oral)  Resp 18  Ht  (1.651 m)  Wt 104 lb 9.6 oz (47.446 kg)  BMI 17.41 kg/m2  SpO2 98% Physical Exam  Constitutional: She is oriented to person, place, and time. She appears well-developed and well-nourished. No distress.  HENT:  Head: Normocephalic and atraumatic.  Eyes: Conjunctivae are normal. Pupils are equal, round, and reactive to light.  Neck: Normal range of motion. Neck supple.  Cardiovascular: Normal rate, regular rhythm and normal heart sounds.  Exam reveals no gallop and no friction rub.   No murmur heard. Pulmonary/Chest: Effort normal and breath sounds normal. She has no wheezes. She has no rales.  Neurological: She is alert and oriented to person, place, and time.  Skin: Rash noted. She is not diaphoretic.     Vesicular rash L buttocks region in clusters.  Forearm with hyperpigmented skin lesion without color variation or irregular borders.  Psychiatric: She has a normal mood and affect. Her behavior is normal.  Nursing note and vitals reviewed.       Assessment & Plan:   1. Herpes zoster   2. Slow transit constipation   3. Seborrheic  keratoses    -New; rx for Valtrex and Neurontin. -recommend dermatology evaluation for full skin survey. -constipation well controlled with Miralax.   No orders of the defined types were placed in this encounter.   Meds ordered this encounter  Medications  . valACYclovir (VALTREX) 1000 MG tablet    Sig: Take 1 tablet (1,000 mg total) by mouth 3 (three) times daily.    Dispense:  30 tablet    Refill:  0  . gabapentin (NEURONTIN) 100 MG capsule    Sig: Take 1-2 capsules (100-200 mg total) by mouth 3 (three) times daily.    Dispense:  60 capsule    Refill:  0    No Follow-up on file.    Deavon Podgorski Paulita FujitaMartin Emmilynn Marut, M.D. [facility]  [place] [address] [place], [place]  [id number] [phone] phone [phone] fax

## 2016-06-06 DIAGNOSIS — C4441 Basal cell carcinoma of skin of scalp and neck: Secondary | ICD-10-CM | POA: Diagnosis not present

## 2016-06-06 DIAGNOSIS — L570 Actinic keratosis: Secondary | ICD-10-CM | POA: Diagnosis not present

## 2016-06-06 DIAGNOSIS — D485 Neoplasm of uncertain behavior of skin: Secondary | ICD-10-CM | POA: Diagnosis not present

## 2016-06-06 DIAGNOSIS — L820 Inflamed seborrheic keratosis: Secondary | ICD-10-CM | POA: Diagnosis not present

## 2016-06-06 DIAGNOSIS — L821 Other seborrheic keratosis: Secondary | ICD-10-CM | POA: Diagnosis not present

## 2016-07-30 DIAGNOSIS — L578 Other skin changes due to chronic exposure to nonionizing radiation: Secondary | ICD-10-CM | POA: Diagnosis not present

## 2016-07-30 DIAGNOSIS — C44319 Basal cell carcinoma of skin of other parts of face: Secondary | ICD-10-CM | POA: Diagnosis not present

## 2016-07-30 DIAGNOSIS — L908 Other atrophic disorders of skin: Secondary | ICD-10-CM | POA: Diagnosis not present

## 2016-09-10 ENCOUNTER — Ambulatory Visit (INDEPENDENT_AMBULATORY_CARE_PROVIDER_SITE_OTHER): Payer: BLUE CROSS/BLUE SHIELD | Admitting: Family Medicine

## 2016-09-10 DIAGNOSIS — Z23 Encounter for immunization: Secondary | ICD-10-CM

## 2016-09-10 NOTE — Progress Notes (Signed)
Patient here for  Flu shot

## 2016-10-31 DIAGNOSIS — Z85828 Personal history of other malignant neoplasm of skin: Secondary | ICD-10-CM | POA: Diagnosis not present

## 2017-01-11 ENCOUNTER — Ambulatory Visit (INDEPENDENT_AMBULATORY_CARE_PROVIDER_SITE_OTHER): Payer: BLUE CROSS/BLUE SHIELD

## 2017-01-11 ENCOUNTER — Ambulatory Visit (HOSPITAL_BASED_OUTPATIENT_CLINIC_OR_DEPARTMENT_OTHER)
Admission: RE | Admit: 2017-01-11 | Discharge: 2017-01-11 | Disposition: A | Discharge status: Home / Self Care | Payer: BLUE CROSS/BLUE SHIELD | Source: Ambulatory Visit | Attending: Urgent Care | Admitting: Urgent Care

## 2017-01-11 ENCOUNTER — Ambulatory Visit (INDEPENDENT_AMBULATORY_CARE_PROVIDER_SITE_OTHER): Payer: BLUE CROSS/BLUE SHIELD | Admitting: Urgent Care

## 2017-01-11 VITALS — BP 110/68 | HR 78 | Temp 98.0°F | Resp 18 | Ht 65.0 in | Wt 107.0 lb

## 2017-01-11 DIAGNOSIS — R937 Abnormal findings on diagnostic imaging of other parts of musculoskeletal system: Secondary | ICD-10-CM | POA: Diagnosis not present

## 2017-01-11 DIAGNOSIS — R1032 Left lower quadrant pain: Secondary | ICD-10-CM | POA: Diagnosis not present

## 2017-01-11 DIAGNOSIS — W19XXXA Unspecified fall, initial encounter: Secondary | ICD-10-CM | POA: Insufficient documentation

## 2017-01-11 DIAGNOSIS — M25552 Pain in left hip: Secondary | ICD-10-CM | POA: Diagnosis not present

## 2017-01-11 DIAGNOSIS — S79912A Unspecified injury of left hip, initial encounter: Secondary | ICD-10-CM | POA: Diagnosis not present

## 2017-01-11 MED ORDER — NAPROXEN SODIUM 550 MG PO TABS: 550.0000 mg | ORAL_TABLET | Freq: Two times a day (BID) | ORAL | 1 refills | Status: DC

## 2017-01-11 NOTE — Patient Instructions (Addendum)
GO TO [facility] [address], [place], [place] [address]  GO TO ER AND TELL THEM YOU ARE HERE TO HAVE A CT SCAN OUTPATIENT AND THEY WILL COME AND GET YOU.    IF you received an x-ray today, you will receive an invoice from [facility]. Please contact [facility] at [phone] with questions or concerns regarding your invoice.   IF you received labwork today, you will receive an invoice from [facility]. Please contact [facility] at [phone] with questions or concerns regarding your invoice.   Our billing staff will not be able to assist you with questions regarding bills from these companies.  You will be contacted with the lab results as soon as they are available. The fastest way to get your results is to activate your My Chart account. Instructions are located on the last page of this paperwork. If you have not heard from us regarding the results in 2 weeks, please contact this office.

## 2017-01-11 NOTE — Progress Notes (Signed)
CALLED TO PA THE CT SCAN AND THEY ARE CLOSED. L/M WITH ALL INFO TO GET PA AND CALL BACK WITH AUTH #

## 2017-01-11 NOTE — Progress Notes (Signed)
  MRN: [id number] DOB: 12/12/1956  Subjective:   Mary Kirk is a 61 y.o. female presenting for chief complaint of Groin Pain (PATIENT HAD A FALL WHEN SHE WAS SKIING)  Reports 5 day history of fall while skiing. Patient was getting off of the ski lift, got pushed over by the ski lift and fell, felt like she twisted her hip or leg. She went down the ski slope and started feeling worse. Over the next few days felt improvement using Alleve initially and then switched to Advil with continued improvement. However,  Since yesterday her left groin, inner thigh is now having sharp pain, can be aggravated by bending, flexing at the hip. Denies further trauma, shooting pain, low back pain, numbness or tingling, incontinence, bruising, swelling, laceration, weakness, hearing popping noises.  Mary Kirk has a current medication list which includes the following prescription(s): valacyclovir. Also is allergic to codeine.  Mary Kirk  has a past medical history of IBS (irritable bowel syndrome). Also  has no past surgical history on file.  Objective:   Vitals: BP 110/68 (BP Location: Right Arm, Patient Position: Sitting, Cuff Size: Small)   Pulse 78   Temp 98 F (36.7 C) (Oral)   Resp 18   Ht 5\' 5"  (1.651 m)   Wt 107 lb (48.5 kg)   SpO2 98%   BMI 17.81 kg/m   Physical Exam  Constitutional: She is oriented to person, place, and time. She appears well-developed and well-nourished.  Cardiovascular: Normal rate.   Pulmonary/Chest: Effort normal.  Musculoskeletal:       Left hip: She exhibits tenderness (focal tenderness over area depicted). She exhibits normal range of motion, normal strength, no bony tenderness, no swelling, no crepitus, no deformity and no laceration.       Legs: No associated ecchymosis.  Neurological: She is alert and oriented to person, place, and time. She displays normal reflexes. Coordination (limping, favoring left leg) abnormal.  Skin: Skin is warm and dry.   Dg Hip Unilat W Or  W/o Pelvis 2-3 Views Left  Result Date: 01/11/2017 CLINICAL DATA:  Fall while skiing a few days ago with persistent left hip and groin pain. EXAM: DG HIP (WITH OR WITHOUT PELVIS) 2-3V LEFT COMPARISON:  None. FINDINGS: There are mild symmetric degenerative changes of the hips. Subtle focal cortical regularity along the inferior cortex of the left superior pubic ramus as cannot exclude a fracture in this location. No evidence of femur fracture. Several pelvic phleboliths. IMPRESSION: Cannot exclude a fracture of the left superior pubic ramus. Recommend pelvic CT for further evaluation. Electronically Signed   By: Elberta Fortisaniel  Boyle M.D.   On: 01/11/2017 16:21   Assessment and Plan :   This case was precepted with Dr. Alvy BimlerSagardia.   1. Fall, initial encounter 2. Left groin pain 3. Abnormal x-ray of pelvis - Will pursue non-contrast CT of hip for further evaluation. Patient is to rest, use NSAID treatment in the meantime.   Wallis BambergMario Eulanda Dorion, PA-C [facility] [phone] 01/11/2017  3:52 PM

## 2017-01-14 ENCOUNTER — Encounter: Payer: Self-pay | Admitting: Family Medicine

## 2017-01-14 ENCOUNTER — Ambulatory Visit (INDEPENDENT_AMBULATORY_CARE_PROVIDER_SITE_OTHER): Payer: BLUE CROSS/BLUE SHIELD | Admitting: Family Medicine

## 2017-01-14 VITALS — BP 135/78 | HR 69 | Temp 98.1°F | Resp 16 | Ht 65.0 in | Wt 107.0 lb

## 2017-01-14 DIAGNOSIS — S73102D Unspecified sprain of left hip, subsequent encounter: Secondary | ICD-10-CM | POA: Diagnosis not present

## 2017-01-14 NOTE — Progress Notes (Signed)
Subjective:    Patient ID: Mary Kirk, female    DOB: 04/17/1956, 61 y.o.   MRN: [id number]  01/14/2017  Follow-up (goin injury/ pt had fallen/ and would like ro review x rays)   HPI This 61 y.o. female presents for evaluation of L groin pain.  [facility]; before, pain cocrrued with flexion of L hip. Now, pain is medial and  Medial side of leg.  With sitting down and bending over.  Pain has changed since onset.  Taking Aleve there.  Started taking Ibuprofen.  Started feeling better when ran errands.  Did run a lot of errands and got in and out of car a lot.  Started hurting between legs. Got car washed.  Thought seat bothered more.  Can hardly walk; must lean on things to relief to pressure with stepping down.  Gets better with Aleve.  Two days ago, really took it easy.     Review of Systems  Constitutional: Negative for chills, diaphoresis, fatigue and fever.  Eyes: Negative for visual disturbance.  Respiratory: Negative for cough and shortness of breath.   Cardiovascular: Negative for chest pain, palpitations and leg swelling.  Gastrointestinal: Negative for abdominal pain, constipation, diarrhea, nausea and vomiting.  Endocrine: Negative for cold intolerance, heat intolerance, polydipsia, polyphagia and polyuria.  Genitourinary: Negative for decreased urine volume, difficulty urinating, dyspareunia, dysuria, enuresis, flank pain, frequency, genital sores, hematuria, pelvic pain, urgency, vaginal bleeding, vaginal discharge and vaginal pain.  Musculoskeletal: Positive for arthralgias and gait problem.  Neurological: Negative for dizziness, tremors, seizures, syncope, facial asymmetry, speech difficulty, weakness, light-headedness, numbness and headaches.    Past Medical History:  Diagnosis Date  . IBS (irritable bowel syndrome)    Constipation predominant   No past surgical history on file. Allergies  Allergen Reactions  . Codeine Nausea Only    Current Outpatient Prescriptions  Medication Sig Dispense Refill  . naproxen sodium (ANAPROX DS) 550 MG tablet Take 1 tablet (550 mg total) by mouth 2 (two) times daily with a meal. 30 tablet 1   No current facility-administered medications for this visit.    Social History   Social History  . Marital status: Married    Spouse name: N/A  . Number of children: N/A  . Years of education: N/A   Occupational History  . unemployed [employer]    previous bank work   Social History Main Topics  . Smoking status: Never Smoker  . Smokeless tobacco: Never Used  . Alcohol use 6.0 oz/week    10 Glasses of wine per week     Comment: wine  . Drug use: No  . Sexual activity: Yes   Other Topics Concern  . Not on file   Social History Narrative   Marital status: married     Children: yes      Lives: with husband      Employment: unemployed; previous bank work.      Tobacco; never      Alcohol: 1-2 glasses of wine per night      Exercise: jogging.   Family History  Problem Relation Age of Onset  . Diabetes Mother   . Heart disease Mother   . Arthritis Father   . Hyperlipidemia Father   . Hypertension Father   . Stroke Father 1480    CVA at age 61; recurrent CVA age 61.       Objective:    BP 135/78   Pulse 69  Temp 98.1 F (36.7 C) (Oral)   Resp 16   Ht 5\' 5"  (1.651 m)   Wt 107 lb (48.5 kg)   SpO2 99%   BMI 17.81 kg/m  Physical Exam  Constitutional: She is oriented to person, place, and time. She appears well-developed and well-nourished. No distress.  HENT:  Head: Normocephalic and atraumatic.  Right Ear: External ear normal.  Left Ear: External ear normal.  Nose: Nose normal.  Mouth/Throat: Oropharynx is clear and moist.  Eyes: Conjunctivae and EOM are normal. Pupils are equal, round, and reactive to light.  Neck: Normal range of motion. Neck supple. Carotid bruit is not present. No thyromegaly present.  Cardiovascular: Normal rate, regular  rhythm, normal heart sounds and intact distal pulses.  Exam reveals no gallop and no friction rub.   No murmur heard. Pulmonary/Chest: Effort normal and breath sounds normal. She has no wheezes. She has no rales.  Abdominal: Soft. Bowel sounds are normal. She exhibits no distension and no mass. There is no tenderness. There is no rebound and no guarding.  Genitourinary:     Genitourinary Comments: +TTP along lateral proximal thigh; no swelling, induration, laceration, or erythema.  Musculoskeletal:       Right hip: Normal.       Left hip: She exhibits normal range of motion, normal strength, no tenderness, no bony tenderness and no swelling.       Lumbar back: She exhibits normal range of motion, no tenderness and no bony tenderness.       Left upper leg: She exhibits no tenderness, no bony tenderness and no swelling.  Pain in L hip/groin region with going from supine to sitting.  Walking with pain; flexion of L hip with pain reproduced.  Full internal and external rotation of L hip without pain; motor 5/5 L hip.    Lymphadenopathy:    She has no cervical adenopathy.  Neurological: She is alert and oriented to person, place, and time. No cranial nerve deficit.  Skin: Skin is warm and dry. No rash noted. She is not diaphoretic. No erythema. No pallor.  Psychiatric: She has a normal mood and affect. Her behavior is normal.   Ct Hip Left Wo Contrast  Result Date: 01/11/2017 CLINICAL DATA:  Left hip and groin pain after skiing injury Monday. EXAM: CT OF THE LEFT HIP WITHOUT CONTRAST TECHNIQUE: Multidetector CT imaging of the left hip was performed according to the standard protocol. Multiplanar CT image reconstructions were also generated. COMPARISON:  Left hip radiographs from 01/11/2017. FINDINGS: Bones/Joint/Cartilage No acute fracture of the left hip and adjacent pubic rami. The visualized left SI joint is intact. The bony sacrum demonstrates no acute fracture. Small phleboliths are seen in  the left hemipelvis. Ligaments Suboptimally assessed by CT. Muscles and Tendons No acute CT abnormality. Tiny ossification along the distal iliopsoas tendon near its insertion likely related to old remote trauma. Soft tissues Negative IMPRESSION: No acute osseous abnormality. Electronically Signed   By: Tollie Eth M.D.   On: 01/11/2017 17:42   Dg Hip Unilat W Or W/o Pelvis 2-3 Views Left  Result Date: 01/11/2017 CLINICAL DATA:  Fall while skiing a few days ago with persistent left hip and groin pain. EXAM: DG HIP (WITH OR WITHOUT PELVIS) 2-3V LEFT COMPARISON:  None. FINDINGS: There are mild symmetric degenerative changes of the hips. Subtle focal cortical regularity along the inferior cortex of the left superior pubic ramus as cannot exclude a fracture in this location. No evidence of femur fracture.  Several pelvic phleboliths. IMPRESSION: Cannot exclude a fracture of the left superior pubic ramus. Recommend pelvic CT for further evaluation. Electronically Signed   By: Elberta Fortis M.D.   On: 01/11/2017 16:21        Assessment & Plan:   1. Hip sprain, left, subsequent encounter    -persistent; reviewed recent imaging studies in detail during visit. -recommend ongoing rest, NSAIDS alternating with Tylenol, and ice versus heat. -if no improvement in upcoming week, call for ortho referral. -home exercise program provided to perform at home.  No orders of the defined types were placed in this encounter.  No orders of the defined types were placed in this encounter.   No Follow-up on file.   Jaykub Mackins Paulita Fujita, M.D. Primary Care at [facility] previously [facility] [address] [place], [place]  [address] [phone] phone [phone] fax

## 2017-01-14 NOTE — Patient Instructions (Addendum)
IF you received an x-ray today, you will receive an invoice from [facility]. Please contact [facility] at [phone] with questions or concerns regarding your invoice.   IF you received labwork today, you will receive an invoice from [facility]. Please contact [facility] at [phone] with questions or concerns regarding your invoice.   Our billing staff will not be able to assist you with questions regarding bills from these companies.  You will be contacted with the lab results as soon as they are available. The fastest way to get your results is to activate your My Chart account. Instructions are located on the last page of this paperwork. If you have not heard from [place] regarding the results in 2 weeks, please contact this office.      Hip Exercises Ask your health care provider which exercises are safe for you. Do exercises exactly as told by your health care provider and adjust them as directed. It is normal to feel mild stretching, pulling, tightness, or discomfort as you do these exercises, but you should stop right away if you feel sudden pain or your pain gets worse.Do not begin these exercises until told by your health care provider. STRETCHING AND RANGE OF MOTION EXERCISES  These exercises warm up your muscles and joints and improve the movement and flexibility of your hip. These exercises also help to relieve pain, numbness, and tingling. Exercise A: Hamstrings, Supine  1. Lie on your back. 2. Loop a belt or towel over the ball of your left / rightfoot. The ball of your foot is on the walking surface, right under your toes. 3. Straighten your left / rightknee and slowly pull on the belt to raise your leg.  Do not let your left / right knee bend while you do this.  Keep your other leg flat on the floor.  Raise the left / right leg until you feel a gentle stretch behind your left / right knee or thigh. 4. Hold this position for __________  seconds. 5. Slowly return your leg to the starting position. Repeat __________ times. Complete this stretch __________ times a day. Exercise B: Hip Rotators  1. Lie on your back on a firm surface. 2. Hold your left / right knee with your left / right hand. Hold your ankle with your other hand. 3. Gently pull your left / right knee and rotate your lower leg toward your other shoulder.  Pull until you feel a stretch in your buttocks.  Keep your hips and shoulders firmly planted while you do this stretch. 4. Hold this position for __________ seconds. Repeat __________ times. Complete this stretch __________ times a day. Exercise C: V-Sit (Hamstrings and Adductors)  1. Sit on the floor with your legs extended in a large "V" shape. Keep your knees straight during this exercise. 2. Start with your head and chest upright, then bend at your waist to reach for your left foot (position A). You should feel a stretch in your right inner thigh. 3. Hold this position for __________ seconds. Then slowly return to the upright position. 4. Bend at your waist to reach forward (position B). You should feel a stretch behind both of your thighs and knees. 5. Hold this position for __________ seconds. Then slowly return to the upright position. 6. Bend at your waist to reach for your right foot (position C). You should feel a stretch in your left inner thigh. 7. Hold this position for __________ seconds. Then slowly return to  the upright position. Repeat __________ times. Complete this stretch __________ times a day. Exercise D: Lunge (Hip Flexors)  1. Place your left / right knee on the floor and bend your other knee so that is directly over your ankle. You should be half-kneeling. 2. Keep good posture with your head over your shoulders. 3. Tighten your buttocks to point your tailbone downward. This helps your back to keep from arching too much. 4. You should feel a gentle stretch in the front of your left /  right thigh and hip. If you do not feel any resistance, slightly slide your other foot forward and then slowly lunge forward so your knee once again lines up over your ankle. 5. Make sure your tailbone continues to point downward. 6. Hold this position for __________ seconds. Repeat __________ times. Complete this stretch __________ times a day. STRENGTHENING EXERCISES  These exercises build strength and endurance in your hip. Endurance is the ability to use your muscles for a long time, even after they get tired. Exercise E: Bridge (Hip Extensors)  1. Lie on your back on a firm surface with your knees bent and your feet flat on the floor. 2. Tighten your buttocks muscles and lift your bottom off the floor until the trunk of your body is level with your thighs.  Do not arch your back.  You should feel the muscles working in your buttocks and the back of your thighs. If you do not feel these muscles, slide your feet 1-2 inches (2.5-5 cm) farther away from your buttocks. 3. Hold this position for __________ seconds. 4. Slowly lower your hips to the starting position. 5. Let your muscles relax completely between repetitions. 6. If this exercise is too easy, try doing it with your arms crossed over your chest. Repeat __________ times. Complete this exercise __________ times a day. Exercise F: Straight Leg Raises - Hip Abductors  1. Lie on your side with your left / right leg in the top position. Lie so your head, shoulder, knee, and hip line up with each other. You may bend your bottom knee to help you balance. 2. Roll your hips slightly forward, so your hips are stacked directly over each other and your left / right knee is facing forward. 3. Leading with your heel, lift your top leg 4-6 inches (10-15 cm). You should feel the muscles in your outer hip lifting.  Do not let your foot drift forward.  Do not let your knee roll toward the ceiling. 4. Hold this position for __________  seconds. 5. Slowly return to the starting position. 6. Let your muscles relax completely between repetitions. Repeat __________ times. Complete this exercise __________ times a day. Exercise G: Straight Leg Raises - Hip Adductors  1. Lie on your side with your left / right leg in the bottom position. Lie so your head, shoulder, knee, and hip line up. You may place your upper foot in front to help you balance. 2. Roll your hips slightly forward, so your hips are stacked directly over each other and your left / right knee is facing forward. 3. Tense the muscles in your inner thigh and lift your bottom leg 4-6 inches (10-15 cm). 4. Hold this position for __________ seconds. 5. Slowly return to the starting position. 6. Let your muscles relax completely between repetitions. Repeat __________ times. Complete this exercise __________ times a day. Exercise H: Straight Leg Raises - Quadriceps  1. Lie on your back with your left / right leg  extended and your other knee bent. 2. Tense the muscles in the front of your left / right thigh. When you do this, you should see your kneecap slide up or see increased dimpling just above your knee. 3. Tighten these muscles even more and raise your leg 4-6 inches (10-15 cm) off the floor. 4. Hold this position for __________ seconds. 5. Keep these muscles tense as you lower your leg. 6. Relax the muscles slowly and completely between repetitions. Repeat __________ times. Complete this exercise __________ times a day. Exercise I: Hip Abductors, Standing 1. Tie one end of a rubber exercise band or tubing to a secure surface, such as a table or pole. 2. Loop the other end of the band or tubing around your left / right ankle. 3. Keeping your ankle with the band or tubing directly opposite of the secured end, step away until there is tension in the tubing or band. Hold onto a chair as needed for balance. 4. Lift your left / right leg out to your side. While you do  this:  Keep your back upright.  Keep your shoulders over your hips.  Keep your toes pointing forward.  Make sure to use your hip muscles to lift your leg. Do not "throw" your leg or tip your body to lift your leg. 5. Hold this position for __________ seconds. 6. Slowly return to the starting position. Repeat __________ times. Complete this exercise __________ times a day. Exercise J: Squats (Quadriceps) 1. Stand in a door frame so your feet and knees are in line with the frame. You may place your hands on the frame for balance. 2. Slowly bend your knees and lower your hips like you are going to sit in a chair.  Keep your lower legs in a straight-up-and-down position.  Do not let your hips go lower than your knees.  Do not bend your knees lower than told by your health care provider.  If your hip pain increases, do not bend as low. 3. Hold this position for ___________ seconds. 4. Slowly push with your legs to return to standing. Do not use your hands to pull yourself to standing. Repeat __________ times. Complete this exercise __________ times a day. This information is not intended to replace advice given to you by your health care provider. Make sure you discuss any questions you have with your health care provider. Document Released: 12/20/2005 Document Revised: 08/26/2016 Document Reviewed: 11/27/2015 [employer] Interactive Patient Education  2017 [employer]

## 2017-02-19 ENCOUNTER — Encounter: Payer: Self-pay | Admitting: Family Medicine

## 2017-02-19 ENCOUNTER — Ambulatory Visit (INDEPENDENT_AMBULATORY_CARE_PROVIDER_SITE_OTHER): Payer: BLUE CROSS/BLUE SHIELD | Admitting: Family Medicine

## 2017-02-19 VITALS — BP 130/81 | HR 72 | Temp 98.2°F | Resp 16 | Ht 65.0 in | Wt 106.0 lb

## 2017-02-19 DIAGNOSIS — Z131 Encounter for screening for diabetes mellitus: Secondary | ICD-10-CM | POA: Diagnosis not present

## 2017-02-19 DIAGNOSIS — R636 Underweight: Secondary | ICD-10-CM

## 2017-02-19 DIAGNOSIS — Z136 Encounter for screening for cardiovascular disorders: Secondary | ICD-10-CM

## 2017-02-19 DIAGNOSIS — Z0000 Encounter for general adult medical examination without abnormal findings: Secondary | ICD-10-CM

## 2017-02-19 DIAGNOSIS — Z1231 Encounter for screening mammogram for malignant neoplasm of breast: Secondary | ICD-10-CM | POA: Diagnosis not present

## 2017-02-19 DIAGNOSIS — H6123 Impacted cerumen, bilateral: Secondary | ICD-10-CM

## 2017-02-19 DIAGNOSIS — Z13220 Encounter for screening for lipoid disorders: Secondary | ICD-10-CM

## 2017-02-19 DIAGNOSIS — Z124 Encounter for screening for malignant neoplasm of cervix: Secondary | ICD-10-CM | POA: Diagnosis not present

## 2017-02-19 DIAGNOSIS — Z1239 Encounter for other screening for malignant neoplasm of breast: Secondary | ICD-10-CM

## 2017-02-19 LAB — POCT URINALYSIS DIP (MANUAL ENTRY)
Bilirubin, UA: NEGATIVE
GLUCOSE UA: NEGATIVE
Ketones, POC UA: NEGATIVE
LEUKOCYTES UA: NEGATIVE
NITRITE UA: NEGATIVE
Protein Ur, POC: NEGATIVE
Spec Grav, UA: 1.015
UROBILINOGEN UA: 0.2
pH, UA: 8.5

## 2017-02-19 MED ORDER — ESTRADIOL 0.1 MG/GM VA CREA: 1.0000 | TOPICAL_CREAM | VAGINAL | 12 refills | Status: DC

## 2017-02-19 NOTE — Progress Notes (Signed)
Subjective:    Patient ID: Mary Kirk, female    DOB: [birth year], 61 y.o.   MRN: [id number]  02/19/2017  Annual Exam   HPI This 61 y.o. female presents Complete Physical Examination.  Last physical: several years ago Pap smear:  2009 Mammogram:  2009 Colonoscopy:  2010 Bone density:  never  Immunization History  Administered Date(s) Administered  . Influenza,inj,Quad PF,36+ Mos 08/31/2014, 11/15/2015, 09/10/2016   BP Readings from Last 3 Encounters:  02/19/17 130/81  01/14/17 135/78  01/11/17 110/68   Wt Readings from Last 3 Encounters:  02/19/17 106 lb (48.1 kg)  01/14/17 107 lb (48.5 kg)  01/11/17 107 lb (48.5 kg)      Review of Systems  Constitutional: Negative for activity change, appetite change, chills, diaphoresis, fatigue, fever and unexpected weight change.  HENT: Negative for congestion, dental problem, drooling, ear discharge, ear pain, facial swelling, hearing loss, mouth sores, nosebleeds, postnasal drip, rhinorrhea, sinus pressure, sneezing, sore throat, tinnitus, trouble swallowing and voice change.   Eyes: Negative for photophobia, pain, discharge, redness, itching and visual disturbance.  Respiratory: Negative for apnea, cough, choking, chest tightness, shortness of breath, wheezing and stridor.   Cardiovascular: Negative for chest pain, palpitations and leg swelling.  Gastrointestinal: Negative for abdominal distention, abdominal pain, anal bleeding, blood in stool, constipation, diarrhea, nausea, rectal pain and vomiting.  Endocrine: Negative for cold intolerance, heat intolerance, polydipsia, polyphagia and polyuria.  Genitourinary: Negative for decreased urine volume, difficulty urinating, dyspareunia, dysuria, enuresis, flank pain, frequency, genital sores, hematuria, menstrual problem, pelvic pain, urgency, vaginal bleeding, vaginal discharge and vaginal pain.  Musculoskeletal: Negative for arthralgias, back pain, gait problem, joint swelling,  myalgias, neck pain and neck stiffness.  Skin: Negative for color change, pallor, rash and wound.  Allergic/Immunologic: Negative for environmental allergies, food allergies and immunocompromised state.  Neurological: Negative for dizziness, tremors, seizures, syncope, facial asymmetry, speech difficulty, weakness, light-headedness, numbness and headaches.  Hematological: Negative for adenopathy. Does not bruise/bleed easily.  Psychiatric/Behavioral: Negative for agitation, behavioral problems, confusion, decreased concentration, dysphoric mood, hallucinations, self-injury, sleep disturbance and suicidal ideas. The patient is not nervous/anxious and is not hyperactive.     Past Medical History:  Diagnosis Date  . Arthritis   . IBS (irritable bowel syndrome)    Constipation predominant   Past Surgical History:  Procedure Laterality Date  . BUNIONECTOMY Bilateral    Allergies  Allergen Reactions  . Codeine Nausea Only   Current Outpatient Prescriptions  Medication Sig Dispense Refill  . acetaminophen (TYLENOL) 325 MG tablet Take 650 mg by mouth every 6 (six) hours as needed.    Marland Kitchen estradiol (ESTRACE) 0.1 MG/GM vaginal cream Place 1 Applicatorful vaginally 3 (three) times a week. 42.5 g 12   No current facility-administered medications for this visit.    Social History   Social History  . Marital status: Married    Spouse name: N/A  . Number of children: 1  . Years of education: N/A   Occupational History  . 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 Smoking status: Never Smoker  . Smokeless tobacco: Never Used  . Alcohol use 6.0 oz/week    10 Glasses of wine per week     Comment: wine  . Drug use: No  . Sexual activity: Yes   Other Topics Concern  . Not on file   Social History Narrative   Marital status: married x 42 years.  Children: 1 son; 2 grandchildren      Lives: with husband      Employment: unemployed;  previous bank work.      Tobacco; never      Alcohol: 1-2 glasses of wine per night      Exercise: jogging and walking in 2018      ADLs: independent      Family History  Problem Relation Age of Onset  . Diabetes Mother   . Heart disease Mother   . Arthritis Father   . Hyperlipidemia Father   . Hypertension Father   . Stroke Father 8080    CVA at age 61; recurrent CVA age 61.       Objective:    BP 130/81   Pulse 72   Temp 98.2 F (36.8 C) (Oral)   Resp 16   Ht 5\' 5"  (1.651 m)   Wt 106 lb (48.1 kg)   SpO2 99%   BMI 17.64 kg/m  Physical Exam  Constitutional: She is oriented to person, place, and time. She appears well-developed and well-nourished. No distress.  HENT:  Head: Normocephalic and atraumatic.  Right Ear: External ear normal.  Left Ear: External ear normal.  Nose: Nose normal.  Mouth/Throat: Oropharynx is clear and moist.  Cerumen impaction B ears.  Eyes: Conjunctivae and EOM are normal. Pupils are equal, round, and reactive to light.  Neck: Normal range of motion and full passive range of motion without pain. Neck supple. No JVD present. Carotid bruit is not present. No thyromegaly present.  Cardiovascular: Normal rate, regular rhythm and normal heart sounds.  Exam reveals no gallop and no friction rub.   No murmur heard. Pulmonary/Chest: Effort normal and breath sounds normal. She has no wheezes. She has no rales. Right breast exhibits no inverted nipple, no mass, no nipple discharge, no skin change and no tenderness. Left breast exhibits no inverted nipple, no mass, no nipple discharge, no skin change and no tenderness. Breasts are symmetrical.  Abdominal: Soft. Bowel sounds are normal. She exhibits no distension and no mass. There is no tenderness. There is no rebound and no guarding.  Genitourinary: Vagina normal and uterus normal. There is no rash, tenderness, lesion or injury on the right labia. There is no rash, tenderness, lesion or injury on the left  labia. Right adnexum displays no mass, no tenderness and no fullness. Left adnexum displays no mass, no tenderness and no fullness.  Musculoskeletal:       Right shoulder: Normal.       Left shoulder: Normal.       Cervical back: Normal.  Lymphadenopathy:    She has no cervical adenopathy.  Neurological: She is alert and oriented to person, place, and time. She has normal reflexes. No cranial nerve deficit. She exhibits normal muscle tone. Coordination normal.  Skin: Skin is warm and dry. No rash noted. She is not diaphoretic. No erythema. No pallor.  Psychiatric: She has a normal mood and affect. Her behavior is normal. Judgment and thought content normal.  Nursing note and vitals reviewed.  [facility] 01/14/2017 04/01/2016 11/15/2015 12/26/2014 07/20/2014  Decreased Interest 0 0 0 0 0  Down, Depressed, Hopeless 0 0 0 0 0  PHQ - 2 Score 0 0 0 0 0   Fall Risk  01/14/2017 01/11/2017 12/26/2014 07/20/2014  Falls in the past year? No Yes No No  Number falls in past yr: - 1 - -  Injury with Fall? - Yes - -  Assessment & Plan:   1. Routine physical examination   2. Cervical cancer screening   3. Breast cancer screening   4. Screening for cardiovascular condition   5. Underweight   6. Screening, lipid   7. Encounter for screening examination for impaired glucose regulation and diabetes mellitus   8. Bilateral impacted cerumen     Orders Placed This Encounter  Procedures  . MM SCREENING BREAST TOMO BILATERAL    Standing Status:   Future    Standing Expiration Date:   04/22/2018    Order Specific Question:   Reason for Exam (SYMPTOM  OR DIAGNOSIS REQUIRED)    Answer:   annual screening    Order Specific Question:   Is the patient pregnant?    Answer:   No    Order Specific Question:   Preferred imaging location?    Answer:   [facility]  . DG Bone Density    Standing Status:   Future    Standing Expiration Date:   04/21/2018    Order Specific Question:   Reason  for Exam (SYMPTOM  OR DIAGNOSIS REQUIRED)    Answer:   estrogen deficiency    Order Specific Question:   Is the patient pregnant?    Answer:   No    Order Specific Question:   Preferred imaging location?    Answer:   [facility]  . CBC with Differential/Platelet  . Comprehensive metabolic panel    Order Specific Question:   Has the patient fasted?    Answer:   Yes  . Lipid panel    Order Specific Question:   Has the patient fasted?    Answer:   Yes  . TSH  . VITAMIN D 25 Hydroxy (Vit-D Deficiency, Fractures)  . Hemoglobin A1c  . Ear wax removal  . POCT urinalysis dipstick  . EKG 12-Lead   Meds ordered this encounter  Medications  . acetaminophen (TYLENOL) 325 MG tablet    Sig: Take 650 mg by mouth every 6 (six) hours as needed.  Marland Kitchen estradiol (ESTRACE) 0.1 MG/GM vaginal cream    Sig: Place 1 Applicatorful vaginally 3 (three) times a week.    Dispense:  42.5 g    Refill:  12    Return in about 1 year (around 02/19/2018) for complete physical examiniation.   Dhiya Smits Paulita Fujita, M.D. Primary Care at [facility] previously [facility] [address] [place], [place]  [address] [phone] phone [phone] fax

## 2017-02-19 NOTE — Patient Instructions (Addendum)
   IF you received an x-ray today, you will receive an invoice from [facility]. Please contact  Radiology at [phone] with questions or concerns regarding your invoice.   IF you received labwork today, you will receive an invoice from [facility]. Please contact [facility] at [phone] with questions or concerns regarding your invoice.   Our billing staff will not be able to assist you with questions regarding bills from these companies.  You will be contacted with the lab results as soon as they are available. The fastest way to get your results is to activate your My Chart account. Instructions are located on the last page of this paperwork. If you have not heard from us regarding the results in 2 weeks, please contact this office.     Keeping You Healthy  Get These Tests  Blood Pressure- Have your blood pressure checked by your healthcare provider at least once a year.  Normal blood pressure is 120/80.  Weight- Have your body mass index (BMI) calculated to screen for obesity.  BMI is a measure of body fat based on height and weight.  You can calculate your own BMI at www.nhlbisupport.com/bmi/  Cholesterol- Have your cholesterol checked every year.  Diabetes- Have your blood sugar checked every year if you have high blood pressure, high cholesterol, a family history of diabetes or if you are overweight.  Pap Test - Have a pap test every 1 to 5 years if you have been sexually active.  If you are older than 65 and recent pap tests have been normal you may not need additional pap tests.  In addition, if you have had a hysterectomy  for benign disease additional pap tests are not necessary.  Mammogram-Yearly mammograms are essential for early detection of breast cancer  Screening for Colon Cancer- Colonoscopy starting at age 50. Screening may begin sooner depending on your family history and other health conditions.  Follow up colonoscopy as directed by your  Gastroenterologist.  Screening for Osteoporosis- Screening begins at age 65 with bone density scanning, sooner if you are at higher risk for developing Osteoporosis.  Get these medicines  Calcium with Vitamin D- Your body requires 1200-1500 mg of Calcium a day and 800-1000 IU of Vitamin D a day.  You can only absorb 500 mg of Calcium at a time therefore Calcium must be taken in 2 or 3 separate doses throughout the day.  Hormones- Hormone therapy has been associated with increased risk for certain cancers and heart disease.  Talk to your healthcare provider about if you need relief from menopausal symptoms.  Aspirin- Ask your healthcare provider about taking Aspirin to prevent Heart Disease and Stroke.  Get these Immuniztions  Flu shot- Every fall  Pneumonia shot- Once after the age of 65; if you are younger ask your healthcare provider if you need a pneumonia shot.  Tetanus- Every ten years.  Zostavax- Once after the age of 60 to prevent shingles.  Take these steps  Don't smoke- Your healthcare provider can help you quit. For tips on how to quit, ask your healthcare provider or go to www.smokefree.gov or call 1-800 QUIT-NOW.  Be physically active- Exercise 5 days a week for a minimum of 30 minutes.  If you are not already physically active, start slow and gradually work up to 30 minutes of moderate physical activity.  Try walking, dancing, bike riding, swimming, etc.  Eat a healthy diet- Eat a variety of healthy foods such as fruits, vegetables, whole grains, low   fat milk, low fat cheeses, yogurt, lean meats, chicken, fish, eggs, dried beans, tofu, etc.  For more information go to www.thenutritionsource.org  Dental visit- Brush and floss teeth twice daily; visit your dentist twice a year.  Eye exam- Visit your Optometrist or Ophthalmologist yearly.  Drink alcohol in moderation- Limit alcohol intake to one drink or less a day.  Never drink and drive.  Depression- Your emotional  health is as important as your physical health.  If you're feeling down or losing interest in things you normally enjoy, please talk to your healthcare provider.  Seat Belts- can save your life; always wear one  Smoke/Carbon Monoxide detectors- These detectors need to be installed on the appropriate level of your home.  Replace batteries at least once a year.  Violence- If anyone is threatening or hurting you, please tell your healthcare provider.  Living Will/ Health care power of attorney- Discuss with your healthcare provider and family.  

## 2017-02-20 LAB — CBC WITH DIFFERENTIAL/PLATELET
BASOS: 1 %
Basophils Absolute: 0.1 10*3/uL (ref 0.0–0.2)
EOS (ABSOLUTE): 0.2 10*3/uL (ref 0.0–0.4)
Eos: 3 %
HEMOGLOBIN: 14.7 g/dL (ref 11.1–15.9)
Hematocrit: 43.4 % (ref 34.0–46.6)
IMMATURE GRANS (ABS): 0 10*3/uL (ref 0.0–0.1)
IMMATURE GRANULOCYTES: 0 %
LYMPHS: 27 %
Lymphocytes Absolute: 1.7 10*3/uL (ref 0.7–3.1)
MCH: 31.7 pg (ref 26.6–33.0)
MCHC: 33.9 g/dL (ref 31.5–35.7)
MCV: 94 fL (ref 79–97)
MONOCYTES: 8 %
Monocytes Absolute: 0.5 10*3/uL (ref 0.1–0.9)
NEUTROS ABS: 3.7 10*3/uL (ref 1.4–7.0)
NEUTROS PCT: 61 %
Platelets: 562 10*3/uL — ABNORMAL HIGH (ref 150–379)
RBC: 4.64 x10E6/uL (ref 3.77–5.28)
RDW: 14.1 % (ref 12.3–15.4)
WBC: 6.2 10*3/uL (ref 3.4–10.8)

## 2017-02-20 LAB — COMPREHENSIVE METABOLIC PANEL
ALBUMIN: 5.1 g/dL — AB (ref 3.6–4.8)
ALT: 42 IU/L — AB (ref 0–32)
AST: 29 IU/L (ref 0–40)
Albumin/Globulin Ratio: 2 (ref 1.2–2.2)
Alkaline Phosphatase: 59 IU/L (ref 39–117)
BILIRUBIN TOTAL: 0.6 mg/dL (ref 0.0–1.2)
BUN / CREAT RATIO: 11 — AB (ref 12–28)
BUN: 8 mg/dL (ref 8–27)
CALCIUM: 10.2 mg/dL (ref 8.7–10.3)
CHLORIDE: 99 mmol/L (ref 96–106)
CO2: 25 mmol/L (ref 18–29)
Creatinine, Ser: 0.73 mg/dL (ref 0.57–1.00)
GFR, EST AFRICAN AMERICAN: 104 mL/min/{1.73_m2} (ref >59)
GFR, EST NON AFRICAN AMERICAN: 90 mL/min/{1.73_m2} (ref >59)
GLUCOSE: 95 mg/dL (ref 65–99)
Globulin, Total: 2.5 g/dL (ref 1.5–4.5)
Potassium: 5.2 mmol/L (ref 3.5–5.2)
Sodium: 142 mmol/L (ref 134–144)
TOTAL PROTEIN: 7.6 g/dL (ref 6.0–8.5)

## 2017-02-20 LAB — LIPID PANEL
CHOL/HDL RATIO: 2.4 ratio (ref 0.0–4.4)
Cholesterol, Total: 265 mg/dL — ABNORMAL HIGH (ref 100–199)
HDL: 110 mg/dL (ref >39)
LDL Calculated: 138 mg/dL — ABNORMAL HIGH (ref 0–99)
Triglycerides: 85 mg/dL (ref 0–149)
VLDL CHOLESTEROL CAL: 17 mg/dL (ref 5–40)

## 2017-02-20 LAB — HEMOGLOBIN A1C
ESTIMATED AVERAGE GLUCOSE: 105 mg/dL
Hgb A1c MFr Bld: 5.3 % (ref 4.8–5.6)

## 2017-02-20 LAB — VITAMIN D 25 HYDROXY (VIT D DEFICIENCY, FRACTURES): Vit D, 25-Hydroxy: 31.6 ng/mL (ref 30.0–100.0)

## 2017-02-20 LAB — TSH: TSH: 1.76 u[IU]/mL (ref 0.450–4.500)

## 2017-02-28 LAB — PAP IG AND HPV HIGH-RISK
HPV, HIGH-RISK: NEGATIVE
PAP Smear Comment: 0

## 2017-03-05 ENCOUNTER — Telehealth: Payer: Self-pay

## 2017-03-05 NOTE — Telephone Encounter (Signed)
Pt called about results, cholesterol elevated Wonders if she should start taking a low dose Asprin too?

## 2017-03-07 NOTE — Telephone Encounter (Signed)
Yes.  Start Aspirin 81mg  daily.  MyChart.

## 2017-04-16 ENCOUNTER — Ambulatory Visit
Admission: RE | Admit: 2017-04-16 | Discharge: 2017-04-16 | Disposition: A | Discharge status: Home / Self Care | Payer: BLUE CROSS/BLUE SHIELD | Source: Ambulatory Visit | Attending: Family Medicine | Admitting: Family Medicine

## 2017-04-16 DIAGNOSIS — M810 Age-related osteoporosis without current pathological fracture: Secondary | ICD-10-CM | POA: Diagnosis not present

## 2017-04-16 DIAGNOSIS — Z780 Asymptomatic menopausal state: Secondary | ICD-10-CM | POA: Diagnosis not present

## 2017-04-16 DIAGNOSIS — Z1231 Encounter for screening mammogram for malignant neoplasm of breast: Secondary | ICD-10-CM | POA: Insufficient documentation

## 2017-04-16 DIAGNOSIS — Z13820 Encounter for screening for osteoporosis: Secondary | ICD-10-CM | POA: Insufficient documentation

## 2017-04-16 DIAGNOSIS — R636 Underweight: Secondary | ICD-10-CM

## 2017-04-16 DIAGNOSIS — M818 Other osteoporosis without current pathological fracture: Secondary | ICD-10-CM | POA: Diagnosis not present

## 2017-04-16 DIAGNOSIS — Z1239 Encounter for other screening for malignant neoplasm of breast: Secondary | ICD-10-CM

## 2017-05-19 ENCOUNTER — Encounter: Payer: Self-pay | Admitting: Family Medicine

## 2017-05-23 NOTE — Telephone Encounter (Signed)
Pt called to check the status of myChart msg.     Please call [phone] or email via MyChart

## 2017-05-27 ENCOUNTER — Telehealth: Payer: Self-pay | Admitting: Family Medicine

## 2017-05-27 ENCOUNTER — Other Ambulatory Visit: Payer: Self-pay | Admitting: Family Medicine

## 2017-05-27 MED ORDER — ALENDRONATE SODIUM 70 MG PO TABS: 70.0000 mg | ORAL_TABLET | ORAL | 3 refills | Status: DC

## 2017-05-27 NOTE — Telephone Encounter (Signed)
PT CALLING TO LET US KNOW THAT SHE WHICH PHARMACIES SHE DO NOT USE [facility] ANYMORE THAT HER NEW PHARMACY IS   [facility]  [address] [place] [id number]  THERE PHONE NUMBER IS [phone]  SHE THINK DR SMITH SENT HER FOSAMAX TO THE WRONG PHARMACY

## 2017-05-28 ENCOUNTER — Other Ambulatory Visit: Payer: Self-pay

## 2017-05-28 MED ORDER — ALENDRONATE SODIUM 70 MG PO TABS: 70.0000 mg | ORAL_TABLET | ORAL | 3 refills | Status: DC

## 2017-05-28 NOTE — Telephone Encounter (Signed)
Pharmacy updated and Rx [facility]

## 2017-07-08 ENCOUNTER — Telehealth: Payer: Self-pay | Admitting: Family Medicine

## 2017-07-08 NOTE — Telephone Encounter (Signed)
Pt is calling to see if it is okay to take an anti inflammatory (like Alieve or Advil).  She is taking the Fosamax and she read that she needed to ask her doctor before she took something for her shoulder because it might cause some adverse affects.  Please advise [phone]

## 2017-07-14 NOTE — Telephone Encounter (Signed)
Pt advised of safe NSAIDS to take for shoulder discomfort. She is currently taking Tylenol as needed and sometimes Aleve.

## 2017-07-24 DIAGNOSIS — D0462 Carcinoma in situ of skin of left upper limb, including shoulder: Secondary | ICD-10-CM | POA: Diagnosis not present

## 2017-07-24 DIAGNOSIS — C44622 Squamous cell carcinoma of skin of right upper limb, including shoulder: Secondary | ICD-10-CM | POA: Diagnosis not present

## 2017-07-24 DIAGNOSIS — Z85828 Personal history of other malignant neoplasm of skin: Secondary | ICD-10-CM | POA: Diagnosis not present

## 2017-07-24 DIAGNOSIS — D485 Neoplasm of uncertain behavior of skin: Secondary | ICD-10-CM | POA: Diagnosis not present

## 2017-09-08 DIAGNOSIS — L905 Scar conditions and fibrosis of skin: Secondary | ICD-10-CM | POA: Diagnosis not present

## 2017-09-08 DIAGNOSIS — C44622 Squamous cell carcinoma of skin of right upper limb, including shoulder: Secondary | ICD-10-CM | POA: Diagnosis not present

## 2017-09-13 ENCOUNTER — Ambulatory Visit (INDEPENDENT_AMBULATORY_CARE_PROVIDER_SITE_OTHER): Payer: BLUE CROSS/BLUE SHIELD | Admitting: Family Medicine

## 2017-09-13 ENCOUNTER — Ambulatory Visit (INDEPENDENT_AMBULATORY_CARE_PROVIDER_SITE_OTHER): Payer: BLUE CROSS/BLUE SHIELD

## 2017-09-13 ENCOUNTER — Encounter: Payer: Self-pay | Admitting: Family Medicine

## 2017-09-13 VITALS — BP 110/68 | HR 65 | Temp 99.0°F | Resp 16 | Ht 64.76 in | Wt 103.0 lb

## 2017-09-13 DIAGNOSIS — M816 Localized osteoporosis [Lequesne]: Secondary | ICD-10-CM

## 2017-09-13 DIAGNOSIS — S43422A Sprain of left rotator cuff capsule, initial encounter: Secondary | ICD-10-CM | POA: Diagnosis not present

## 2017-09-13 DIAGNOSIS — K219 Gastro-esophageal reflux disease without esophagitis: Secondary | ICD-10-CM

## 2017-09-13 DIAGNOSIS — M25512 Pain in left shoulder: Secondary | ICD-10-CM

## 2017-09-13 DIAGNOSIS — Z23 Encounter for immunization: Secondary | ICD-10-CM | POA: Diagnosis not present

## 2017-09-13 NOTE — Progress Notes (Signed)
Subjective:    Patient ID: Mary Kirk, female    DOB: 1956-11-02, 61 y.o.   MRN: [id number]  09/13/2017  Arm Pain (pt states she has not been able to rotate her left arm for 2 months and having some slight pain. )   HPI This 61 y.o. female presents for evaluation of LEFT shoulder pain; onset L lateral arm pain; coul.d not tell if muscular; hurts when rotates behind to fasten seatbelt. Unable to go back as far.  Now having trouble sleeping; had anothre skin cancer remived.  Trying to sleep on back.  Hurts to sleep on it.  Posterior shoulder pain.   Fosamax is bothering stomach.  Taking Ibuprofen two and then one; stomach upset.  Tylenol every now and then.  Mobility has decreased.  Lifting to the side hurts and rotationg out hurts. Reaching back hurts.   No weight lifting regularly; eight pound weights.  Now having neck pain because of how sleeping.  Neck pain is new.    Tolerating Fosamax; does upset stomach some but tolerable.  Exercising five days per week running for weight bearing exercise.  Was not taking calcium until diagnosed with osteoporosis.  Gastritis/GERD: stomach feels much better if patient takes Zantac bid.  If does not take, nausea occurs when hungry or when has an empty stomach.  BP Readings from Last 3 Encounters:  09/13/17 110/68  02/19/17 130/81  01/14/17 135/78   Wt Readings from Last 3 Encounters:  09/13/17 103 lb (46.7 kg)  02/19/17 106 lb (48.1 kg)  01/14/17 107 lb (48.5 kg)   Immunization History  Administered Date(s) Administered  . Influenza,inj,Quad PF,6+ Mos 08/31/2014, 11/15/2015, 09/10/2016, 09/13/2017    Review of Systems  Constitutional: Negative for chills, diaphoresis, fatigue and fever.  HENT: Negative for ear pain, postnasal drip, rhinorrhea, sinus pressure, sore throat and trouble swallowing.   Respiratory: Negative for cough and shortness of breath.   Cardiovascular: Negative for chest pain, palpitations and leg swelling.    Gastrointestinal: Positive for constipation and nausea. Negative for abdominal distention, abdominal pain, anal bleeding, blood in stool, diarrhea, rectal pain and vomiting.  Musculoskeletal: Positive for arthralgias, neck pain and neck stiffness.  Neurological: Negative for weakness and numbness.    Past Medical History:  Diagnosis Date  . Arthritis   . IBS (irritable bowel syndrome)    Constipation predominant   Past Surgical History:  Procedure Laterality Date  . BUNIONECTOMY Bilateral    Allergies  Allergen Reactions  . Codeine Nausea Only   Current Outpatient Prescriptions  Medication Sig Dispense Refill  . acetaminophen (TYLENOL) 325 MG tablet Take 650 mg by mouth every 6 (six) hours as needed.    Marland Kitchen alendronate (FOSAMAX) 70 MG tablet Take 1 tablet (70 mg total) by mouth every 7 (seven) days. Take with a full glass of water on an empty stomach. 12 tablet 3  . estradiol (ESTRACE) 0.1 MG/GM vaginal cream Place 1 Applicatorful vaginally 3 (three) times a week. 42.5 g 12   No current facility-administered medications for this visit.    Social History   Social History  . Marital status: Married    Spouse name: N/A  . Number of children: 1  . Years of education: N/A   Occupational History  . 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 Smoking status: Never Smoker  . Smokeless tobacco: Never Used  . Alcohol use 6.0 oz/week    10 Glasses  of wine per week     Comment: wine  . Drug use: No  . Sexual activity: Yes   Other Topics Concern  . Not on file   Social History Narrative   Marital status: married x 42 years.     Children: 1 son; 2 grandchildren      Lives: with husband      Employment: unemployed; previous bank work.      Tobacco; never      Alcohol: 1-2 glasses of wine per night      Exercise: jogging and walking in 2018      ADLs: independent      Family History  Problem Relation Age of Onset  . Diabetes  Mother   . Heart disease Mother   . Arthritis Father   . Hyperlipidemia Father   . Hypertension Father   . Stroke Father 23       CVA at age 48; recurrent CVA age 16.  . Breast cancer Maternal Grandmother 60       Objective:    BP 110/68   Pulse 65   Temp 99 F (37.2 C) (Oral)   Resp 16   Ht 5' 4.76" (1.645 m)   Wt 103 lb (46.7 kg)   SpO2 98%   BMI 17.27 kg/m  Physical Exam  Constitutional: She is oriented to person, place, and time. She appears well-developed and well-nourished. No distress.  HENT:  Head: Normocephalic and atraumatic.  Eyes: Pupils are equal, round, and reactive to light. Conjunctivae are normal.  Neck: Normal range of motion. Neck supple.  Cardiovascular: Normal rate, regular rhythm and normal heart sounds.  Exam reveals no gallop and no friction rub.   No murmur heard. Pulmonary/Chest: Effort normal and breath sounds normal. She has no wheezes. She has no rales.  Musculoskeletal:       Left shoulder: She exhibits decreased range of motion, tenderness and pain. She exhibits no bony tenderness, no swelling, no effusion, no crepitus, no laceration, no spasm, normal pulse and normal strength.       Cervical back: She exhibits pain and spasm. She exhibits normal range of motion, no tenderness, no bony tenderness and normal pulse.  L trapezius TTP with spasm. L Shoulder: +TTP distal aspect of deltoid posterior arm; pain with elevation above 90 degrees to the side; unable to elevate above 100 degrees actively; can passively elevated to 150 only.  Cross over equivocal. Empty can sign negative.  No pain with internal and external rotation.  Neurological: She is alert and oriented to person, place, and time.  Skin: She is not diaphoretic.  Psychiatric: She has a normal mood and affect. Her behavior is normal.  Nursing note and vitals reviewed.   No results found. Depression screen [facility] 2/9 09/13/2017 01/14/2017 04/01/2016 11/15/2015 12/26/2014  Decreased Interest 0 0  0 0 0  Down, Depressed, Hopeless 0 0 0 0 0  PHQ - 2 Score 0 0 0 0 0   Fall Risk  09/13/2017 01/14/2017 01/11/2017 12/26/2014 07/20/2014  Falls in the past year? No No Yes No No  Number falls in past yr: - - 1 - -  Injury with Fall? - - Yes - -    No results found.     Assessment & Plan:   1. Acute pain of left shoulder   2. Sprain of left rotator cuff capsule, initial encounter   3. Need for prophylactic vaccination and inoculation against influenza   4. Localized osteoporosis without current  pathological fracture   5. Gastroesophageal reflux disease without esophagitis    -New onset; pt intolerant to NSAIDs due to GI side effects; pt declined rx for Voltaren gel yet can call if desires. Recommend Tylenol qhs.   -home exercise program provided to perform daily. -if no improvement in 4 weeks, call for ortho referral. -tolerating Fosamax; has started calcium supplement and vitamin D supplement; running five days per week. -recurrent GERD symptoms; well controlled with daily Zantac bid.   Orders Placed This Encounter  Procedures  . DG Shoulder Left    Standing Status:   Future    Number of Occurrences:   1    Standing Expiration Date:   09/13/2018    Order Specific Question:   Reason for Exam (SYMPTOM  OR DIAGNOSIS REQUIRED)    Answer:   L posterior shoulder pain    Order Specific Question:   Preferred imaging location?    Answer:   External  . Flu Vaccine QUAD 36+ mos IM   No orders of the defined types were placed in this encounter.   No Follow-up on file.   Samanthajo Payano Paulita Fujita, M.D. Primary Care at [facility] previously [facility] [address] [place], [place]  [address] [phone] phone [phone] fax

## 2017-09-13 NOTE — Patient Instructions (Addendum)
   IF you received an x-ray today, you will receive an invoice from [facility]. Please contact [facility] at [phone] with questions or concerns regarding your invoice.   IF you received labwork today, you will receive an invoice from [facility]. Please contact [facility] at [phone] with questions or concerns regarding your invoice.   Our billing staff will not be able to assist you with questions regarding bills from these companies.  You will be contacted with the lab results as soon as they are available. The fastest way to get your results is to activate your My Chart account. Instructions are located on the last page of this paperwork. If you have not heard from us regarding the results in 2 weeks, please contact this office.      Shoulder Impingement Syndrome Rehab Ask your health care provider which exercises are safe for you. Do exercises exactly as told by your health care provider and adjust them as directed. It is normal to feel mild stretching, pulling, tightness, or discomfort as you do these exercises, but you should stop right away if you feel sudden pain or your pain gets worse.Do not begin these exercises until told by your health care provider. Stretching and range of motion exercise This exercise warms up your muscles and joints and improves the movement and flexibility of your shoulder. This exercise also helps to relieve pain and stiffness. Exercise A: Passive horizontal adduction  1. Sit or stand and pull your left / right elbow across your chest, toward your other shoulder. Stop when you feel a gentle stretch in the back of your shoulder and upper arm. ? Keep your arm at shoulder height. ? Keep your arm as close to your body as you comfortably can. 2. Hold for __________ seconds. 3. Slowly return to the starting position. Repeat __________ times. Complete this exercise __________ times a day. Strengthening exercises These exercises build  strength and endurance in your shoulder. Endurance is the ability to use your muscles for a long time, even after they get tired. Exercise B: External rotation, isometric 1. Stand or sit in a doorway, facing the door frame. 2. Bend your left / right elbow and place the back of your wrist against the door frame. Only your wrist should be touching the frame. Keep your upper arm at your side. 3. Gently press your wrist against the door frame, as if you are trying to push your arm away from your abdomen. ? Avoid shrugging your shoulder while you press your hand against the door frame. Keep your shoulder blade tucked down toward the middle of your back. 4. Hold for __________ seconds. 5. Slowly release the tension, and relax your muscles completely before you do the exercise again. Repeat __________ times. Complete this exercise __________ times a day. Exercise C: Internal rotation, isometric  1. Stand or sit in a doorway, facing the door frame. 2. Bend your left / right elbow and place the inside of your wrist against the door frame. Only your wrist should be touching the frame. Keep your upper arm at your side. 3. Gently press your wrist against the door frame, as if you are trying to push your arm toward your abdomen. ? Avoid shrugging your shoulder while you press your hand against the door frame. Keep your shoulder blade tucked down toward the middle of your back. 4. Hold for __________ seconds. 5. Slowly release the tension, and relax your muscles completely before you do the exercise again. Repeat __________   times. Complete this exercise __________ times a day. Exercise D: Scapular protraction, supine  1. Lie on your back on a firm surface. Hold a __________ weight in your left / right hand. 2. Raise your left / right arm straight into the air so your hand is directly above your shoulder joint. 3. Push the weight into the air so your shoulder lifts off of the surface that you are lying on. Do  not move your head, neck, or back. 4. Hold for __________ seconds. 5. Slowly return to the starting position. Let your muscles relax completely before you repeat this exercise. Repeat __________ times. Complete this exercise __________ times a day. Exercise E: Scapular retraction  1. Sit in a stable chair without armrests, or stand. 2. Secure an exercise band to a stable object in front of you so the band is at shoulder height. 3. Hold one end of the exercise band in each hand. Your palms should face down. 4. Squeeze your shoulder blades together and move your elbows slightly behind you. Do not shrug your shoulders while you do this. 5. Hold for __________ seconds. 6. Slowly return to the starting position. Repeat __________ times. Complete this exercise __________ times a day. Exercise F: Shoulder extension  1. Sit in a stable chair without armrests, or stand. 2. Secure an exercise band to a stable object in front of you where the band is above shoulder height. 3. Hold one end of the exercise band in each hand. 4. Straighten your elbows and lift your hands up to shoulder height. 5. Squeeze your shoulder blades together and pull your hands down to the sides of your thighs. Stop when your hands are straight down by your sides. Do not let your hands go behind your body. 6. Hold for __________ seconds. 7. Slowly return to the starting position. Repeat __________ times. Complete this exercise __________ times a day. This information is not intended to replace advice given to you by your health care provider. Make sure you discuss any questions you have with your health care provider. Document Released: 12/02/2005 Document Revised: 08/08/2016 Document Reviewed: 11/04/2015 Elsevier Interactive Patient Education  2018 Elsevier Inc.  

## 2017-09-14 ENCOUNTER — Encounter: Payer: Self-pay | Admitting: Family Medicine

## 2017-09-14 DIAGNOSIS — M816 Localized osteoporosis [Lequesne]: Secondary | ICD-10-CM | POA: Insufficient documentation

## 2017-09-14 DIAGNOSIS — K219 Gastro-esophageal reflux disease without esophagitis: Secondary | ICD-10-CM | POA: Insufficient documentation

## 2017-09-15 ENCOUNTER — Ambulatory Visit: Payer: BLUE CROSS/BLUE SHIELD | Admitting: Family Medicine

## 2017-09-22 DIAGNOSIS — D0462 Carcinoma in situ of skin of left upper limb, including shoulder: Secondary | ICD-10-CM | POA: Diagnosis not present

## 2017-10-14 ENCOUNTER — Encounter: Payer: Self-pay | Admitting: Family Medicine

## 2017-10-14 DIAGNOSIS — S43402A Unspecified sprain of left shoulder joint, initial encounter: Secondary | ICD-10-CM

## 2017-10-21 NOTE — Addendum Note (Signed)
Addended by: Ethelda ChickSMITH,  M on: 10/21/2017 09:38 AM   Modules accepted: Orders

## 2017-10-31 DIAGNOSIS — M25512 Pain in left shoulder: Secondary | ICD-10-CM | POA: Diagnosis not present

## 2017-10-31 DIAGNOSIS — G8929 Other chronic pain: Secondary | ICD-10-CM | POA: Diagnosis not present

## 2017-10-31 DIAGNOSIS — M25612 Stiffness of left shoulder, not elsewhere classified: Secondary | ICD-10-CM | POA: Diagnosis not present

## 2017-11-12 DIAGNOSIS — M25512 Pain in left shoulder: Secondary | ICD-10-CM | POA: Diagnosis not present

## 2017-11-12 DIAGNOSIS — G8929 Other chronic pain: Secondary | ICD-10-CM | POA: Diagnosis not present

## 2017-11-17 DIAGNOSIS — G8929 Other chronic pain: Secondary | ICD-10-CM | POA: Diagnosis not present

## 2017-11-17 DIAGNOSIS — M25512 Pain in left shoulder: Secondary | ICD-10-CM | POA: Diagnosis not present

## 2017-11-20 DIAGNOSIS — G8929 Other chronic pain: Secondary | ICD-10-CM | POA: Diagnosis not present

## 2017-11-20 DIAGNOSIS — M25512 Pain in left shoulder: Secondary | ICD-10-CM | POA: Diagnosis not present

## 2017-11-27 DIAGNOSIS — M25512 Pain in left shoulder: Secondary | ICD-10-CM | POA: Diagnosis not present

## 2017-11-27 DIAGNOSIS — G8929 Other chronic pain: Secondary | ICD-10-CM | POA: Diagnosis not present

## 2017-12-01 DIAGNOSIS — G8929 Other chronic pain: Secondary | ICD-10-CM | POA: Diagnosis not present

## 2017-12-01 DIAGNOSIS — M25512 Pain in left shoulder: Secondary | ICD-10-CM | POA: Diagnosis not present

## 2017-12-04 DIAGNOSIS — G8929 Other chronic pain: Secondary | ICD-10-CM | POA: Diagnosis not present

## 2017-12-04 DIAGNOSIS — M25512 Pain in left shoulder: Secondary | ICD-10-CM | POA: Diagnosis not present

## 2017-12-11 DIAGNOSIS — M25512 Pain in left shoulder: Secondary | ICD-10-CM | POA: Diagnosis not present

## 2017-12-11 DIAGNOSIS — G8929 Other chronic pain: Secondary | ICD-10-CM | POA: Diagnosis not present

## 2017-12-18 DIAGNOSIS — G8929 Other chronic pain: Secondary | ICD-10-CM | POA: Diagnosis not present

## 2017-12-18 DIAGNOSIS — M25512 Pain in left shoulder: Secondary | ICD-10-CM | POA: Diagnosis not present

## 2017-12-22 DIAGNOSIS — G8929 Other chronic pain: Secondary | ICD-10-CM | POA: Diagnosis not present

## 2017-12-22 DIAGNOSIS — M25512 Pain in left shoulder: Secondary | ICD-10-CM | POA: Diagnosis not present

## 2017-12-24 DIAGNOSIS — L570 Actinic keratosis: Secondary | ICD-10-CM | POA: Diagnosis not present

## 2017-12-24 DIAGNOSIS — Z85828 Personal history of other malignant neoplasm of skin: Secondary | ICD-10-CM | POA: Diagnosis not present

## 2017-12-26 DIAGNOSIS — M25512 Pain in left shoulder: Secondary | ICD-10-CM | POA: Diagnosis not present

## 2017-12-26 DIAGNOSIS — G8929 Other chronic pain: Secondary | ICD-10-CM | POA: Diagnosis not present

## 2017-12-29 DIAGNOSIS — M25512 Pain in left shoulder: Secondary | ICD-10-CM | POA: Diagnosis not present

## 2017-12-29 DIAGNOSIS — G8929 Other chronic pain: Secondary | ICD-10-CM | POA: Diagnosis not present

## 2018-01-06 DIAGNOSIS — G8929 Other chronic pain: Secondary | ICD-10-CM | POA: Diagnosis not present

## 2018-01-06 DIAGNOSIS — M25512 Pain in left shoulder: Secondary | ICD-10-CM | POA: Diagnosis not present

## 2018-01-08 DIAGNOSIS — M25512 Pain in left shoulder: Secondary | ICD-10-CM | POA: Diagnosis not present

## 2018-01-08 DIAGNOSIS — G8929 Other chronic pain: Secondary | ICD-10-CM | POA: Diagnosis not present

## 2018-01-12 DIAGNOSIS — M25512 Pain in left shoulder: Secondary | ICD-10-CM | POA: Diagnosis not present

## 2018-01-12 DIAGNOSIS — G8929 Other chronic pain: Secondary | ICD-10-CM | POA: Diagnosis not present

## 2018-01-15 DIAGNOSIS — M25512 Pain in left shoulder: Secondary | ICD-10-CM | POA: Diagnosis not present

## 2018-01-15 DIAGNOSIS — G8929 Other chronic pain: Secondary | ICD-10-CM | POA: Diagnosis not present

## 2018-01-21 DIAGNOSIS — D2372 Other benign neoplasm of skin of left lower limb, including hip: Secondary | ICD-10-CM | POA: Diagnosis not present

## 2018-01-21 DIAGNOSIS — M79672 Pain in left foot: Secondary | ICD-10-CM | POA: Diagnosis not present

## 2018-01-22 DIAGNOSIS — G8929 Other chronic pain: Secondary | ICD-10-CM | POA: Diagnosis not present

## 2018-01-22 DIAGNOSIS — M25512 Pain in left shoulder: Secondary | ICD-10-CM | POA: Diagnosis not present

## 2018-01-28 ENCOUNTER — Encounter: Payer: Self-pay | Admitting: Physician Assistant

## 2018-01-28 ENCOUNTER — Ambulatory Visit (INDEPENDENT_AMBULATORY_CARE_PROVIDER_SITE_OTHER): Payer: BLUE CROSS/BLUE SHIELD | Admitting: Physician Assistant

## 2018-01-28 VITALS — BP 108/64 | HR 68 | Temp 98.0°F | Resp 16 | Ht 64.0 in | Wt 105.6 lb

## 2018-01-28 DIAGNOSIS — Z23 Encounter for immunization: Secondary | ICD-10-CM

## 2018-01-28 DIAGNOSIS — Z681 Body mass index (BMI) 19 or less, adult: Secondary | ICD-10-CM | POA: Insufficient documentation

## 2018-01-28 DIAGNOSIS — Z0000 Encounter for general adult medical examination without abnormal findings: Secondary | ICD-10-CM | POA: Diagnosis not present

## 2018-01-28 DIAGNOSIS — Z114 Encounter for screening for human immunodeficiency virus [HIV]: Secondary | ICD-10-CM | POA: Diagnosis not present

## 2018-01-28 DIAGNOSIS — N941 Unspecified dyspareunia: Secondary | ICD-10-CM | POA: Insufficient documentation

## 2018-01-28 DIAGNOSIS — E785 Hyperlipidemia, unspecified: Secondary | ICD-10-CM | POA: Diagnosis not present

## 2018-01-28 DIAGNOSIS — Z1329 Encounter for screening for other suspected endocrine disorder: Secondary | ICD-10-CM

## 2018-01-28 DIAGNOSIS — M19041 Primary osteoarthritis, right hand: Secondary | ICD-10-CM | POA: Insufficient documentation

## 2018-01-28 DIAGNOSIS — Z130 Encounter for screening for diseases of the blood and blood-forming organs and certain disorders involving the immune mechanism: Secondary | ICD-10-CM | POA: Diagnosis not present

## 2018-01-28 DIAGNOSIS — M19042 Primary osteoarthritis, left hand: Secondary | ICD-10-CM

## 2018-01-28 DIAGNOSIS — Z1389 Encounter for screening for other disorder: Secondary | ICD-10-CM

## 2018-01-28 DIAGNOSIS — Z1211 Encounter for screening for malignant neoplasm of colon: Secondary | ICD-10-CM | POA: Diagnosis not present

## 2018-01-28 DIAGNOSIS — Z131 Encounter for screening for diabetes mellitus: Secondary | ICD-10-CM | POA: Diagnosis not present

## 2018-01-28 LAB — URINALYSIS, DIPSTICK ONLY
BILIRUBIN UA: NEGATIVE
Glucose, UA: NEGATIVE
Ketones, UA: NEGATIVE
Leukocytes, UA: NEGATIVE
Nitrite, UA: NEGATIVE
PH UA: 8 — AB (ref 5.0–7.5)
Protein, UA: NEGATIVE
RBC, UA: NEGATIVE
Specific Gravity, UA: 1.014 (ref 1.005–1.030)
UUROB: 0.2 mg/dL (ref 0.2–1.0)

## 2018-01-28 NOTE — Progress Notes (Signed)
Patient ID: Mary Kirk, female    DOB: [birth year], 62 y.o.   MRN: [id number]  PCP: Ethelda Chick, MD  Chief Complaint  Patient presents with  . Annual Exam    Form placed in door    Subjective:   Presents for [employer] Visit.  Cervical Cancer Screening: 02/19/2017. Normal cytology and negative HPV.  Repeat in 2023. Breast Cancer Screening: Normal mammogram in May 2018 Colorectal Cancer Screening: No documentation of previous colonoscopy, reportedly in 2009. Bone Density Testing: Known osteoporosis.  Most recent bone density test in 2018. HIV Screening: No previous documentation. STI Screening: Very low risk.  Declines. Seasonal Influenza Vaccination: Current. Td/Tdap Vaccination: Due today. Pneumococcal Vaccination: Not yet candidate. Zoster Vaccination: Not yet a candidate. Frequency of Dental evaluation: Q6 months Frequency of Eye evaluation: >2 years  Review of Systems  Constitutional: Negative.   HENT: Negative.   Eyes: Negative.   Respiratory: Negative.   Cardiovascular: Negative.   Gastrointestinal: Positive for constipation (uses Miralax daily).  Endocrine: Negative.   Genitourinary: Positive for dyspareunia (swelling, tenderness, no itching. Feels very dry.).  Musculoskeletal: Positive for arthralgias (Hands.  Known osteoarthritis.  Decreased grip strength.  Not limiting her activities.  Uses acetaminophen as needed.).  Skin: Negative.   Allergic/Immunologic: Negative.   Neurological: Negative.   Hematological: Negative.   Psychiatric/Behavioral: Negative.     Patient Active Problem List   Diagnosis Date Noted  . Hyperlipidemia 01/28/2018  . Body mass index (BMI) 19.9 or less, adult 01/28/2018  . Dyspareunia in female 01/28/2018  . Osteoarthritis of both hands 01/28/2018  . Localized osteoporosis without current pathological fracture 09/14/2017  . Gastroesophageal reflux disease without esophagitis 09/14/2017    Past Medical History:    Diagnosis Date  . Arthritis   . IBS (irritable bowel syndrome)    Constipation predominant  . Osteoporosis     Prior to Admission medications   Medication Sig Start Date End Date Taking? Authorizing Provider  alendronate (FOSAMAX) 70 MG tablet Take by mouth. 05/28/17  Yes [provider]  aspirin 81 MG chewable tablet Chew by mouth daily.   Yes [provider]  Calcium Carb-Cholecalciferol (CALCIUM 600 + D PO) Take 600 mg by mouth 2 (two) times daily.   Yes [provider]  estradiol (ESTRACE) 0.1 MG/GM vaginal cream Place vaginally. 02/19/17  Yes [provider]  acetaminophen (TYLENOL) 325 MG tablet Take 650 mg by mouth every 6 (six) hours as needed.    [provider]    Allergies  Allergen Reactions  . Codeine Nausea Only    Past Surgical History:  Procedure Laterality Date  . BUNIONECTOMY Bilateral     Family History  Problem Relation Age of Onset  . Diabetes Mother   . Heart disease Mother   . Arthritis Father   . Hyperlipidemia Father   . Hypertension Father   . Stroke Father 57       CVA at age 78; recurrent CVA age 66.  . Breast cancer Maternal Grandmother 55    Social History   Socioeconomic History  . Marital status: Married    Spouse name: None  . Number of children: 1  . Years of education: None  . Highest education level: None  Social Needs  . Financial resource strain: None  . Food insecurity - worry: None  . Food insecurity - inability: None  . Transportation needs - medical: None  . Transportation needs - non-medical: None  Occupational History  .  Occupation: unemployed    Employer: [employer]    Comment: previous bank work  Tobacco Use  . Smoking status: Never Smoker  . Smokeless tobacco: Never Used  Substance and Sexual Activity  . Alcohol use: Yes    Alcohol/week: 6.0 oz    Types: 10 Glasses of wine per week    Comment: wine  . Drug use: No  . Sexual activity: Yes  Other  Topics Concern  . None  Social History Narrative   Marital status: married x 42 years.     Children: 1 son; 2 grandchildren      Lives: with husband      Employment: unemployed; previous bank work.      Tobacco; never      Alcohol: 1-2 glasses of wine per night      Exercise: jogging and walking in 2018      ADLs: independent        Objective:  Physical Exam  Constitutional: She is oriented to person, place, and time. Vital signs are normal. She appears well-developed and well-nourished. She is active and cooperative. No distress.  BP 108/64   Pulse 68   Temp 98 F (36.7 C) (Oral)   Resp 16   Ht 5\' 4"  (1.626 m)   Wt 105 lb 9.6 oz (47.9 kg)   SpO2 99%   BMI 18.13 kg/m    HENT:  Head: Normocephalic and atraumatic.  Right Ear: Hearing, tympanic membrane, external ear and ear canal normal. No foreign bodies.  Left Ear: Hearing, tympanic membrane, external ear and ear canal normal. No foreign bodies.  Nose: Nose normal.  Mouth/Throat: Uvula is midline, oropharynx is clear and moist and mucous membranes are normal. No oral lesions. Normal dentition. No dental abscesses or uvula swelling. No oropharyngeal exudate.  Eyes: Conjunctivae, EOM and lids are normal. Pupils are equal, round, and reactive to light. Right eye exhibits no discharge. Left eye exhibits no discharge. No scleral icterus.  Fundoscopic exam:      The right eye shows no arteriolar narrowing, no AV nicking, no exudate, no hemorrhage and no papilledema. The right eye shows red reflex.       The left eye shows no arteriolar narrowing, no AV nicking, no exudate, no hemorrhage and no papilledema. The left eye shows red reflex.  Neck: Trachea normal, normal range of motion and full passive range of motion without pain. Neck supple. No spinous process tenderness and no muscular tenderness present. No thyroid mass and no thyromegaly present.  Cardiovascular: Normal rate, regular rhythm, normal heart sounds, intact distal  pulses and normal pulses.  Pulmonary/Chest: Effort normal and breath sounds normal. Right breast exhibits no inverted nipple, no mass, no nipple discharge, no skin change and no tenderness. Left breast exhibits no inverted nipple, no mass, no nipple discharge, no skin change and no tenderness. Breasts are symmetrical.  Musculoskeletal: She exhibits no edema or tenderness.       Cervical back: Normal.       Thoracic back: Normal.       Lumbar back: Normal.  Lymphadenopathy:       Head (right side): No tonsillar, no preauricular, no posterior auricular and no occipital adenopathy present.       Head (left side): No tonsillar, no preauricular, no posterior auricular and no occipital adenopathy present.    She has no cervical adenopathy.       Right: No supraclavicular adenopathy present.       Left: No supraclavicular  adenopathy present.  Neurological: She is alert and oriented to person, place, and time. She has normal strength and normal reflexes. No cranial nerve deficit. She exhibits normal muscle tone. Coordination and gait normal.  Skin: Skin is warm, dry and intact. No rash noted. She is not diaphoretic. No cyanosis or erythema. Nails show no clubbing.  Psychiatric: She has a normal mood and affect. Her speech is normal and behavior is normal. Judgment and thought content normal.     Wt Readings from Last 3 Encounters:  01/28/18 105 lb 9.6 oz (47.9 kg)  09/13/17 103 lb (46.7 kg)  02/19/17 106 lb (48.1 kg)     Visual Acuity Screening   Right eye Left eye Both eyes  Without correction:     With correction: 20/25 -1 20/25 20/20         Assessment & Plan:   Problem List Items Addressed This Visit    Hyperlipidemia    Await lab results.      Relevant Medications   aspirin 81 MG chewable tablet   Other Relevant Orders   Comprehensive metabolic panel (Completed)   Lipid panel (Completed)   Body mass index (BMI) 19.9 or less, adult    Healthy eating, weightbearing  exercise.      Dyspareunia in female    Currently using Estrace.  Not wanting to consider oral estrogen.  Liberal use of lubrication.      Osteoarthritis of both hands    Continue as needed acetaminophen.  If her symptoms worsen would consider NSAIDs.      Relevant Medications   aspirin 81 MG chewable tablet    Other Visit Diagnoses    Annual physical exam    -  Primary   Age-appropriate anticipatory guidance provided.   Screening for deficiency anemia       Relevant Orders   CBC with Differential/Platelet (Completed)   Screening for diabetes mellitus       Relevant Orders   Hemoglobin A1c (Completed)   Screening for thyroid disorder       Relevant Orders   TSH (Completed)   Need for Tdap vaccination       Relevant Orders   Tdap vaccine greater than or equal to 7yo IM (Completed)   Screening for HIV (human immunodeficiency virus)       Relevant Orders   HIV antibody (Completed)   Screening for colon cancer       Relevant Orders   Ambulatory referral to Gastroenterology   Screening for blood or protein in urine       Relevant Orders   Urinalysis, dipstick only (Completed)       Return in about 1 year (around 01/28/2019) for Annual exam, sooner pending lab results.   Fernande Bras, PA-C [facility]

## 2018-01-28 NOTE — Patient Instructions (Addendum)
You will get a call from the GI office to schedule the colonoscopy. Add some upper body weights and a stress ball to work on grip strength. Try the estrogen cream daily for a few weeks to see if the discomfort resolves. If it doesn't, we can consider oral hormone replacement.  IF you received an x-ray today, you will receive an invoice from [facility]. Please contact [facility] at [phone] with questions or concerns regarding your invoice.   IF you received labwork today, you will receive an invoice from [facility]. Please contact [facility] at [phone] with questions or concerns regarding your invoice.   Our billing staff will not be able to assist you with questions regarding bills from these companies.  You will be contacted with the lab results as soon as they are available. The fastest way to get your results is to activate your My Chart account. Instructions are located on the last page of this paperwork. If you have not heard from [place] regarding the results in 2 weeks, please contact this office.     Preventive Care 40-64 Years, Female Preventive care refers to lifestyle choices and visits with your health care provider that can promote health and wellness. What does preventive care include?  A yearly physical exam. This is also called an annual well check.  Dental exams once or twice a year.  Routine eye exams. Ask your health care provider how often you should have your eyes checked.  Personal lifestyle choices, including: ? Daily care of your teeth and gums. ? Regular physical activity. ? Eating a healthy diet. ? Avoiding tobacco and drug use. ? Limiting alcohol use. ? Practicing safe sex. ? Taking low-dose aspirin daily starting at age 18. ? Taking vitamin and mineral supplements as recommended by your health care provider. What happens during an annual well check? The services and screenings done by your health care provider during your annual well  check will depend on your age, overall health, lifestyle risk factors, and family history of disease. Counseling Your health care provider may ask you questions about your:  Alcohol use.  Tobacco use.  Drug use.  Emotional well-being.  Home and relationship well-being.  Sexual activity.  Eating habits.  Work and work Statistician.  Method of birth control.  Menstrual cycle.  Pregnancy history.  Screening You may have the following tests or measurements:  Height, weight, and BMI.  Blood pressure.  Lipid and cholesterol levels. These may be checked every 5 years, or more frequently if you are over 53 years old.  Skin check.  Lung cancer screening. You may have this screening every year starting at age 48 if you have a 30-pack-year history of smoking and currently smoke or have quit within the past 15 years.  Fecal occult blood test (FOBT) of the stool. You may have this test every year starting at age 51.  Flexible sigmoidoscopy or colonoscopy. You may have a sigmoidoscopy every 5 years or a colonoscopy every 10 years starting at age 36.  Hepatitis C blood test.  Hepatitis B blood test.  Sexually transmitted disease (STD) testing.  Diabetes screening. This is done by checking your blood sugar (glucose) after you have not eaten for a while (fasting). You may have this done every 1-3 years.  Mammogram. This may be done every 1-2 years. Talk to your health care provider about when you should start having regular mammograms. This may depend on whether you have a family history of breast cancer.  BRCA-related  cancer screening. This may be done if you have a family history of breast, ovarian, tubal, or peritoneal cancers.  Pelvic exam and Pap test. This may be done every 3 years starting at age 4. Starting at age 39, this may be done every 5 years if you have a Pap test in combination with an HPV test.  Bone density scan. This is done to screen for osteoporosis. You  may have this scan if you are at high risk for osteoporosis.  Discuss your test results, treatment options, and if necessary, the need for more tests with your health care provider. Vaccines Your health care provider may recommend certain vaccines, such as:  Influenza vaccine. This is recommended every year.  Tetanus, diphtheria, and acellular pertussis (Tdap, Td) vaccine. You may need a Td booster every 10 years.  Varicella vaccine. You may need this if you have not been vaccinated.  Zoster vaccine. You may need this after age 69.  Measles, mumps, and rubella (MMR) vaccine. You may need at least one dose of MMR if you were born in 1957 or later. You may also need a second dose.  Pneumococcal 13-valent conjugate (PCV13) vaccine. You may need this if you have certain conditions and were not previously vaccinated.  Pneumococcal polysaccharide (PPSV23) vaccine. You may need one or two doses if you smoke cigarettes or if you have certain conditions.  Meningococcal vaccine. You may need this if you have certain conditions.  Hepatitis A vaccine. You may need this if you have certain conditions or if you travel or work in places where you may be exposed to hepatitis A.  Hepatitis B vaccine. You may need this if you have certain conditions or if you travel or work in places where you may be exposed to hepatitis B.  Haemophilus influenzae type b (Hib) vaccine. You may need this if you have certain conditions.  Talk to your health care provider about which screenings and vaccines you need and how often you need them. This information is not intended to replace advice given to you by your health care provider. Make sure you discuss any questions you have with your health care provider. Document Released: 12/29/2015 Document Revised: 08/21/2016 Document Reviewed: 10/03/2015 Elsevier Interactive Patient Education  [employer].

## 2018-01-29 ENCOUNTER — Encounter: Payer: Self-pay | Admitting: Family Medicine

## 2018-01-29 LAB — COMPREHENSIVE METABOLIC PANEL
A/G RATIO: 1.8 (ref 1.2–2.2)
ALT: 21 IU/L (ref 0–32)
AST: 21 IU/L (ref 0–40)
Albumin: 4.9 g/dL — ABNORMAL HIGH (ref 3.6–4.8)
Alkaline Phosphatase: 31 IU/L — ABNORMAL LOW (ref 39–117)
BUN/Creatinine Ratio: 14 (ref 12–28)
BUN: 12 mg/dL (ref 8–27)
Bilirubin Total: 0.6 mg/dL (ref 0.0–1.2)
CO2: 23 mmol/L (ref 20–29)
CREATININE: 0.86 mg/dL (ref 0.57–1.00)
Calcium: 10.1 mg/dL (ref 8.7–10.3)
Chloride: 97 mmol/L (ref 96–106)
GFR calc non Af Amer: 73 mL/min/{1.73_m2} (ref >59)
GFR, EST AFRICAN AMERICAN: 84 mL/min/{1.73_m2} (ref >59)
GLOBULIN, TOTAL: 2.8 g/dL (ref 1.5–4.5)
Glucose: 100 mg/dL — ABNORMAL HIGH (ref 65–99)
POTASSIUM: 5 mmol/L (ref 3.5–5.2)
SODIUM: 138 mmol/L (ref 134–144)
TOTAL PROTEIN: 7.7 g/dL (ref 6.0–8.5)

## 2018-01-29 LAB — HEMOGLOBIN A1C
ESTIMATED AVERAGE GLUCOSE: 103 mg/dL
Hgb A1c MFr Bld: 5.2 % (ref 4.8–5.6)

## 2018-01-29 LAB — CBC WITH DIFFERENTIAL/PLATELET
BASOS: 1 %
Basophils Absolute: 0 10*3/uL (ref 0.0–0.2)
EOS (ABSOLUTE): 0.2 10*3/uL (ref 0.0–0.4)
EOS: 3 %
HEMATOCRIT: 44.1 % (ref 34.0–46.6)
HEMOGLOBIN: 15 g/dL (ref 11.1–15.9)
IMMATURE GRANS (ABS): 0 10*3/uL (ref 0.0–0.1)
IMMATURE GRANULOCYTES: 0 %
LYMPHS: 21 %
Lymphocytes Absolute: 1.3 10*3/uL (ref 0.7–3.1)
MCH: 31.8 pg (ref 26.6–33.0)
MCHC: 34 g/dL (ref 31.5–35.7)
MCV: 94 fL (ref 79–97)
MONOCYTES: 9 %
Monocytes Absolute: 0.6 10*3/uL (ref 0.1–0.9)
NEUTROS PCT: 66 %
Neutrophils Absolute: 4.2 10*3/uL (ref 1.4–7.0)
Platelets: 535 10*3/uL — ABNORMAL HIGH (ref 150–379)
RBC: 4.71 x10E6/uL (ref 3.77–5.28)
RDW: 13.7 % (ref 12.3–15.4)
WBC: 6.4 10*3/uL (ref 3.4–10.8)

## 2018-01-29 LAB — LIPID PANEL
Chol/HDL Ratio: 2.1 ratio (ref 0.0–4.4)
Cholesterol, Total: 251 mg/dL — ABNORMAL HIGH (ref 100–199)
HDL: 119 mg/dL (ref >39)
LDL Calculated: 117 mg/dL — ABNORMAL HIGH (ref 0–99)
TRIGLYCERIDES: 75 mg/dL (ref 0–149)
VLDL CHOLESTEROL CAL: 15 mg/dL (ref 5–40)

## 2018-01-29 LAB — HIV ANTIBODY (ROUTINE TESTING W REFLEX): HIV SCREEN 4TH GENERATION: NONREACTIVE

## 2018-01-29 LAB — TSH: TSH: 2.76 u[IU]/mL (ref 0.450–4.500)

## 2018-01-30 MED ORDER — ALENDRONATE SODIUM 70 MG PO TABS: 70.0000 mg | ORAL_TABLET | ORAL | 3 refills | Status: DC

## 2018-02-04 DIAGNOSIS — M79672 Pain in left foot: Secondary | ICD-10-CM | POA: Diagnosis not present

## 2018-02-04 DIAGNOSIS — D2372 Other benign neoplasm of skin of left lower limb, including hip: Secondary | ICD-10-CM | POA: Diagnosis not present

## 2018-02-05 DIAGNOSIS — M25512 Pain in left shoulder: Secondary | ICD-10-CM | POA: Diagnosis not present

## 2018-02-05 DIAGNOSIS — G8929 Other chronic pain: Secondary | ICD-10-CM | POA: Diagnosis not present

## 2018-02-06 ENCOUNTER — Encounter: Payer: BLUE CROSS/BLUE SHIELD | Admitting: Physician Assistant

## 2018-02-11 ENCOUNTER — Encounter: Payer: Self-pay | Admitting: Physician Assistant

## 2018-02-11 NOTE — Assessment & Plan Note (Signed)
Currently using Estrace.  Not wanting to consider oral estrogen.  Liberal use of lubrication.

## 2018-02-11 NOTE — Assessment & Plan Note (Signed)
Await lab results. 

## 2018-02-11 NOTE — Assessment & Plan Note (Signed)
Continue as needed acetaminophen.  If her symptoms worsen would consider NSAIDs.

## 2018-02-11 NOTE — Assessment & Plan Note (Signed)
Healthy eating, weightbearing exercise.

## 2018-02-18 DIAGNOSIS — M79672 Pain in left foot: Secondary | ICD-10-CM | POA: Diagnosis not present

## 2018-02-18 DIAGNOSIS — D2372 Other benign neoplasm of skin of left lower limb, including hip: Secondary | ICD-10-CM | POA: Diagnosis not present

## 2018-02-26 ENCOUNTER — Encounter: Payer: Self-pay | Admitting: Physician Assistant

## 2018-02-26 DIAGNOSIS — M25512 Pain in left shoulder: Secondary | ICD-10-CM | POA: Diagnosis not present

## 2018-02-26 DIAGNOSIS — G8929 Other chronic pain: Secondary | ICD-10-CM | POA: Diagnosis not present

## 2018-04-15 ENCOUNTER — Encounter: Payer: Self-pay | Admitting: Physician Assistant

## 2018-04-17 ENCOUNTER — Telehealth: Payer: Self-pay | Admitting: Gastroenterology

## 2018-04-17 NOTE — Telephone Encounter (Signed)
Patient is returning our call to schedule a colonoscopy

## 2018-04-20 ENCOUNTER — Telehealth: Payer: Self-pay

## 2018-04-20 NOTE — Telephone Encounter (Signed)
Gastroenterology Pre-Procedure Review  Request Date: 05/22/18 Requesting Physician: Dr. Servando Snare  PATIENT REVIEW QUESTIONS: The patient responded to the following health history questions as indicated:    1. Are you having any GI issues? yes (increased constipation despite miralax BID, and some epigastric discomfort) 2. Do you have a personal history of Polyps? no 3. Do you have a family history of Colon Cancer or Polyps? no 4. Diabetes Mellitus? no 5. Joint replacements in the past 12 months?no 6. Major health problems in the past 3 months?no 7. Any artificial heart valves, MVP, or defibrillator?no    MEDICATIONS & ALLERGIES:    Patient reports the following regarding taking any anticoagulation/antiplatelet therapy:   Plavix, Coumadin, Eliquis, Xarelto, Lovenox, Pradaxa, Brilinta, or Effient? no Aspirin? no  Patient confirms/reports the following medications:  Current Outpatient Medications  Medication Sig Dispense Refill  . acetaminophen (TYLENOL) 325 MG tablet Take 650 mg by mouth every 6 (six) hours as needed.    Marland Kitchen alendronate (FOSAMAX) 70 MG tablet Take 1 tablet (70 mg total) by mouth once a week. 12 tablet 3  . aspirin 81 MG chewable tablet Chew by mouth daily.    . Calcium Carb-Cholecalciferol (CALCIUM 600 + D PO) Take 600 mg by mouth 2 (two) times daily.    Marland Kitchen estradiol (ESTRACE) 0.1 MG/GM vaginal cream Place vaginally.     No current facility-administered medications for this visit.     Patient confirms/reports the following allergies:  Allergies  Allergen Reactions  . Codeine Nausea Only    No orders of the defined types were placed in this encounter.   AUTHORIZATION INFORMATION Primary Insurance: 1D#: Group #:  Secondary Insurance: 1D#: Group #:  SCHEDULE INFORMATION: Date:05/22/18  Time: Location:[facility]

## 2018-04-22 ENCOUNTER — Other Ambulatory Visit: Payer: Self-pay

## 2018-04-22 DIAGNOSIS — Z1211 Encounter for screening for malignant neoplasm of colon: Secondary | ICD-10-CM

## 2018-05-11 ENCOUNTER — Encounter: Payer: Self-pay | Admitting: Family Medicine

## 2018-05-15 ENCOUNTER — Other Ambulatory Visit: Payer: Self-pay

## 2018-05-22 ENCOUNTER — Encounter
Admission: RE | Disposition: A | Discharge status: Home / Self Care | Payer: Self-pay | Source: Ambulatory Visit | Attending: Gastroenterology

## 2018-05-22 ENCOUNTER — Ambulatory Visit: Payer: BLUE CROSS/BLUE SHIELD | Admitting: Anesthesiology

## 2018-05-22 ENCOUNTER — Ambulatory Visit
Admission: RE | Admit: 2018-05-22 | Discharge: 2018-05-22 | Disposition: A | Discharge status: Home / Self Care | Payer: BLUE CROSS/BLUE SHIELD | Source: Ambulatory Visit | Attending: Gastroenterology | Admitting: Gastroenterology

## 2018-05-22 DIAGNOSIS — Z7983 Long term (current) use of bisphosphonates: Secondary | ICD-10-CM | POA: Diagnosis not present

## 2018-05-22 DIAGNOSIS — K219 Gastro-esophageal reflux disease without esophagitis: Secondary | ICD-10-CM | POA: Insufficient documentation

## 2018-05-22 DIAGNOSIS — K5730 Diverticulosis of large intestine without perforation or abscess without bleeding: Secondary | ICD-10-CM | POA: Insufficient documentation

## 2018-05-22 DIAGNOSIS — Z79899 Other long term (current) drug therapy: Secondary | ICD-10-CM | POA: Insufficient documentation

## 2018-05-22 DIAGNOSIS — Z1211 Encounter for screening for malignant neoplasm of colon: Secondary | ICD-10-CM

## 2018-05-22 DIAGNOSIS — Z7982 Long term (current) use of aspirin: Secondary | ICD-10-CM | POA: Insufficient documentation

## 2018-05-22 DIAGNOSIS — K640 First degree hemorrhoids: Secondary | ICD-10-CM | POA: Insufficient documentation

## 2018-05-22 DIAGNOSIS — Z79890 Hormone replacement therapy (postmenopausal): Secondary | ICD-10-CM | POA: Diagnosis not present

## 2018-05-22 HISTORY — DX: Gastro-esophageal reflux disease without esophagitis: K21.9

## 2018-05-22 HISTORY — PX: COLONOSCOPY WITH PROPOFOL: SHX5780

## 2018-05-22 SURGERY — COLONOSCOPY WITH PROPOFOL
Anesthesia: General | Wound class: Contaminated

## 2018-05-22 MED ORDER — OXYCODONE HCL 5 MG PO TABS: 5.0000 mg | ORAL_TABLET | Freq: Once | ORAL | Status: DC | PRN

## 2018-05-22 MED ORDER — MEPERIDINE HCL 25 MG/ML IJ SOLN: 6.2500 mg | INTRAMUSCULAR | Status: DC | PRN

## 2018-05-22 MED ORDER — PROPOFOL 10 MG/ML IV BOLUS
INTRAVENOUS | Status: DC | PRN
  Administered 2018-05-22 (×5): 20 mg via INTRAVENOUS
  Administered 2018-05-22: 70 mg via INTRAVENOUS
  Administered 2018-05-22 (×3): 20 mg via INTRAVENOUS

## 2018-05-22 MED ORDER — SODIUM CHLORIDE 0.9 % IV SOLN: INTRAVENOUS | Status: DC

## 2018-05-22 MED ORDER — FENTANYL CITRATE (PF) 100 MCG/2ML IJ SOLN: 25.0000 ug | INTRAMUSCULAR | Status: DC | PRN

## 2018-05-22 MED ORDER — PROMETHAZINE HCL 25 MG/ML IJ SOLN
6.2500 mg | INTRAMUSCULAR | Status: DC | PRN
Start: 2018-05-22 — End: 2018-05-22

## 2018-05-22 MED ORDER — LIDOCAINE HCL (CARDIAC) PF 100 MG/5ML IV SOSY
PREFILLED_SYRINGE | INTRAVENOUS | Status: DC | PRN
  Administered 2018-05-22: 40 mg via INTRAVENOUS

## 2018-05-22 MED ORDER — OXYCODONE HCL 5 MG/5ML PO SOLN: 5.0000 mg | Freq: Once | ORAL | Status: DC | PRN

## 2018-05-22 MED ORDER — STERILE WATER FOR IRRIGATION IR SOLN
Status: DC | PRN
  Administered 2018-05-22: 11:00:00

## 2018-05-22 MED ORDER — LACTATED RINGERS IV SOLN
INTRAVENOUS | Status: DC
  Administered 2018-05-22: 09:00:00 via INTRAVENOUS

## 2018-05-22 SURGICAL SUPPLY — 24 items
CANISTER SUCT 1200ML W/VALVE (MISCELLANEOUS) ×2 IMPLANT
CLIP HMST 235XBRD CATH ROT (MISCELLANEOUS) IMPLANT
CLIP RESOLUTION 360 11X235 (MISCELLANEOUS)
ELECT REM PT RETURN 9FT ADLT (ELECTROSURGICAL)
ELECTRODE REM PT RTRN 9FT ADLT (ELECTROSURGICAL) IMPLANT
FCP ESCP3.2XJMB 240X2.8X (MISCELLANEOUS)
FORCEPS BIOP RAD 4 LRG CAP 4 (CUTTING FORCEPS) IMPLANT
FORCEPS BIOP RJ4 240 W/NDL (MISCELLANEOUS)
FORCEPS ESCP3.2XJMB 240X2.8X (MISCELLANEOUS) IMPLANT
GOWN CVR UNV OPN BCK APRN NK (MISCELLANEOUS) ×2 IMPLANT
GOWN ISOL THUMB LOOP REG UNIV (MISCELLANEOUS) ×4
INJECTOR VARIJECT VIN23 (MISCELLANEOUS) IMPLANT
KIT DEFENDO VALVE AND CONN (KITS) IMPLANT
KIT ENDO PROCEDURE OLY (KITS) ×2 IMPLANT
MARKER SPOT ENDO TATTOO 5ML (MISCELLANEOUS) IMPLANT
PROBE APC STR FIRE (PROBE) IMPLANT
RETRIEVER NET ROTH 2.5X230 LF (MISCELLANEOUS) IMPLANT
SNARE SHORT THROW 13M SML OVAL (MISCELLANEOUS) IMPLANT
SNARE SHORT THROW 30M LRG OVAL (MISCELLANEOUS) IMPLANT
SNARE SNG USE RND 15MM (INSTRUMENTS) IMPLANT
SPOT EX ENDOSCOPIC TATTOO (MISCELLANEOUS)
TRAP ETRAP POLY (MISCELLANEOUS) IMPLANT
VARIJECT INJECTOR VIN23 (MISCELLANEOUS)
WATER STERILE IRR 250ML POUR (IV SOLUTION) ×2 IMPLANT

## 2018-05-22 NOTE — Op Note (Signed)
[facility] Gastroenterology Patient Name: Mary Kirk Procedure Date: 05/22/2018 10:29 AM MRN: [id number] Account #: [id number] Date of Birth: 13-Apr-1956 Admit Type: Outpatient Age: 62 Room: [facility] OR ROOM 01 Gender: Female Note Status: Finalized Procedure:            Colonoscopy Indications:          Screening for colorectal malignant neoplasm Providers:            Midge Minium MD, MD Referring MD:         Myrle Sheng. Katrinka Blazing, MD (Referring MD) Medicines:            Propofol per Anesthesia Complications:        No immediate complications. Procedure:            Pre-Anesthesia Assessment:                       - Prior to the procedure, a History and Physical was                        performed, and patient medications and allergies were                        reviewed. The patient's tolerance of previous                        anesthesia was also reviewed. The risks and benefits of                        the procedure and the sedation options and risks were                        discussed with the patient. All questions were                        answered, and informed consent was obtained. Prior                        Anticoagulants: The patient has taken no previous                        anticoagulant or antiplatelet agents. ASA Grade                        Assessment: II - A patient with mild systemic disease.                        After reviewing the risks and benefits, the patient was                        deemed in satisfactory condition to undergo the                        procedure.                       After obtaining informed consent, the colonoscope was                        passed under direct vision. Throughout the procedure,  the patient's blood pressure, pulse, and oxygen                        saturations were monitored continuously. The                        Colonoscope was introduced through the anus and        advanced to the the cecum, identified by appendiceal                        orifice and ileocecal valve. The colonoscopy was                        performed without difficulty. The patient tolerated the                        procedure well. The quality of the bowel preparation                        was excellent. Findings:      The perianal and digital rectal examinations were normal.      A few small-mouthed diverticula were found in the sigmoid colon.      Non-bleeding internal hemorrhoids were found during retroflexion. The       hemorrhoids were Grade I (internal hemorrhoids that do not prolapse). Impression:           - Diverticulosis in the sigmoid colon.                       - Non-bleeding internal hemorrhoids.                       - No specimens collected. Recommendation:       - Discharge patient to home.                       - Resume previous diet.                       - Continue present medications.                       - Repeat colonoscopy in 10 years for screening unless                        any change in family history or lower GI problems. Procedure Code(s):    --- Professional ---                       [phone], Colonoscopy, flexible; diagnostic, including                        collection of specimen(s) by brushing or washing, when                        performed (separate procedure) Diagnosis Code(s):    --- Professional ---                       Z12.11, Encounter for screening for malignant neoplasm  of colon CPT copyright 2017 American Medical Association. All rights reserved. The codes documented in this report are preliminary and upon coder review may  be revised to meet current compliance requirements. [name] Bartt Gonzaga MD, MD 05/22/2018 10:56:16 AM This report has been signed electronically. Number of Addenda: 0 Note Initiated On: 05/22/2018 10:29 AM Scope Withdrawal Time: 0 hours 6 minutes 48 seconds  Total Procedure Duration: 0  hours 17 minutes 34 seconds       [facility]

## 2018-05-22 NOTE — Transfer of Care (Signed)
Immediate Anesthesia Transfer of Care Note  Patient: Mary Kirk  Procedure(s) Performed: COLONOSCOPY WITH PROPOFOL (N/A )  Patient Location: PACU  Anesthesia Type: General  Level of Consciousness: awake, alert  and patient cooperative  Airway and Oxygen Therapy: Patient Spontanous Breathing and Patient connected to supplemental oxygen  Post-op Assessment: Post-op Vital signs reviewed, Patient's Cardiovascular Status Stable, Respiratory Function Stable, Patent Airway and No signs of Nausea or vomiting  Post-op Vital Signs: Reviewed and stable  Complications: No apparent anesthesia complications

## 2018-05-22 NOTE — H&P (Signed)
[name] [name], MD [facility] [address] [place], [place] [address] Phone: [phone] Fax : [phone]  Primary Care Physician:  Ethelda ChickSmith, Kristi M, MD Primary Gastroenterologist:  Dr. Servando SnareWohl  Pre-Procedure History & Physical: HPI:  Mary Kirk is a 62 y.o. female is here for a screening colonoscopy.   Past Medical History:  Diagnosis Date  . Arthritis   . GERD (gastroesophageal reflux disease)   . IBS (irritable bowel syndrome)    Constipation predominant  . Osteoporosis     Past Surgical History:  Procedure Laterality Date  . BUNIONECTOMY Bilateral     Prior to Admission medications   Medication Sig Start Date End Date Taking? Authorizing Provider  acetaminophen (TYLENOL) 325 MG tablet Take 650 mg by mouth every 6 (six) hours as needed.   Yes [provider]  alendronate (FOSAMAX) 70 MG tablet Take 1 tablet (70 mg total) by mouth once a week. 01/30/18  Yes Jeffery, Chelle, PA-C  aspirin 81 MG chewable tablet Chew by mouth daily.   Yes [provider]  Calcium Carb-Cholecalciferol (CALCIUM 600 + D PO) Take 600 mg by mouth 2 (two) times daily.   Yes [provider]  ranitidine (ZANTAC) 150 MG tablet Take 150 mg by mouth 2 (two) times daily.   Yes [provider]  estradiol (ESTRACE) 0.1 MG/GM vaginal cream Place vaginally. 02/19/17   [provider]    Allergies as of 04/22/2018 - Review Complete 01/28/2018  Allergen Reaction Noted  . Codeine Nausea Only 07/20/2014    Family History  Problem Relation Age of Onset  . Diabetes Mother   . Heart disease Mother   . Arthritis Father   . Hyperlipidemia Father   . Hypertension Father   . Stroke Father 1380       CVA at age 62; recurrent CVA age 62.  . Dementia Father   . Breast cancer Maternal Grandmother 5860    Social History   Socioeconomic History  . Marital status: Married    Spouse name: Not on file  . Number of children: 1  . Years of education: Not on file    . Highest education level: Not on file  Occupational History  . Occupation: unemployed    Employer: [employer]    Comment: previous bank work  Engineer, productionocial Needs  . Financial resource strain: Not on file  . Food insecurity:    Worry: Not on file    Inability: Not on file  . Transportation needs:    Medical: Not on file    Non-medical: Not on file  Tobacco Use  . Smoking status: Never Smoker  . Smokeless tobacco: Never Used  Substance and Sexual Activity  . Alcohol use: Yes    Alcohol/week: 6.0 oz    Types: 10 Glasses of wine per week    Comment: wine  . Drug use: No  . Sexual activity: Yes  Lifestyle  . Physical activity:    Days per week: 4 days    Minutes per session: 150+ min  . Stress: Not on file  Relationships  . Social connections:    Talks on phone: Not on file    Gets together: Not on file    Attends religious service: Not on file    Active member of club or organization: Not on file    Attends meetings of clubs or organizations: Not on file    Relationship status: Not on file  . Intimate partner violence:  Fear of current or ex partner: Not on file    Emotionally abused: Not on file    Physically abused: Not on file    Forced sexual activity: Not on file  Other Topics Concern  . Not on file  Social History Narrative   Marital status: married x 42 years.     Children: 1 son; 2 grandchildren      Lives: with husband      Employment: unemployed; previous bank work.      Tobacco; never      Alcohol: 1-2 glasses of wine per night      Exercise: jogging and walking in 2018      ADLs: independent    Review of Systems: See HPI, otherwise negative ROS  Physical Exam: BP 133/77   Pulse 61   Temp 97.6 F (36.4 C)   Ht 5\' 5"  (1.651 m)   Wt 104 lb (47.2 kg)   SpO2 100%   BMI 17.31 kg/m  General:   Alert,  pleasant and cooperative in NAD Head:  Normocephalic and atraumatic. Neck:  Supple; no masses or thyromegaly. Lungs:  Clear throughout  to auscultation.    Heart:  Regular rate and rhythm. Abdomen:  Soft, nontender and nondistended. Normal bowel sounds, without guarding, and without rebound.   Neurologic:  Alert and  oriented x4;  grossly normal neurologically.  Impression/Plan: Mary Ano is now here to undergo a screening colonoscopy.  Risks, benefits, and alternatives regarding colonoscopy have been reviewed with the patient.  Questions have been answered.  All parties agreeable.

## 2018-05-22 NOTE — Anesthesia Postprocedure Evaluation (Signed)
Anesthesia Post Note  Patient: Mary Kirk  Procedure(s) Performed: COLONOSCOPY WITH PROPOFOL (N/A )  Patient location during evaluation: PACU Anesthesia Type: General Level of consciousness: awake and alert Pain management: pain level controlled Vital Signs Assessment: post-procedure vital signs reviewed and stable Respiratory status: spontaneous breathing, nonlabored ventilation, respiratory function stable and patient connected to nasal cannula oxygen Cardiovascular status: blood pressure returned to baseline and stable Postop Assessment: no apparent nausea or vomiting Anesthetic complications: no    Jacaden Forbush ELAINE

## 2018-05-22 NOTE — Anesthesia Procedure Notes (Signed)
Performed by: Rheta Hemmelgarn, CRNA Pre-anesthesia Checklist: Patient identified, Emergency Drugs available, Suction available, Timeout performed and Patient being monitored Patient Re-evaluated:Patient Re-evaluated prior to induction Oxygen Delivery Method: Nasal cannula Placement Confirmation: positive ETCO2       

## 2018-05-22 NOTE — Anesthesia Preprocedure Evaluation (Signed)
Anesthesia Evaluation  Patient identified by MRN, date of birth, ID band Patient awake    Reviewed: Allergy & Precautions, H&P , NPO status , Patient's Chart, lab work & pertinent test results, reviewed documented beta blocker date and time   Airway Mallampati: II  TM Distance: >3 FB Neck ROM: full    Dental no notable dental hx.    Pulmonary neg pulmonary ROS,    Pulmonary exam normal breath sounds clear to auscultation       Cardiovascular Exercise Tolerance: Good negative cardio ROS   Rhythm:regular Rate:Normal     Neuro/Psych negative neurological ROS  negative psych ROS   GI/Hepatic Neg liver ROS, GERD  ,IBS   Endo/Other  negative endocrine ROS  Renal/GU negative Renal ROS  negative genitourinary   Musculoskeletal   Abdominal   Peds  Hematology negative hematology ROS (+)   Anesthesia Other Findings Arthritis  Reproductive/Obstetrics negative OB ROS                             Anesthesia Physical Anesthesia Plan  ASA: II  Anesthesia Plan: General   Post-op Pain Management:    Induction:   PONV Risk Score and Plan:   Airway Management Planned:   Additional Equipment:   Intra-op Plan:   Post-operative Plan:   Informed Consent: I have reviewed the patients History and Physical, chart, labs and discussed the procedure including the risks, benefits and alternatives for the proposed anesthesia with the patient or authorized representative who has indicated his/her understanding and acceptance.   Dental Advisory Given  Plan Discussed with: CRNA  Anesthesia Plan Comments:         Anesthesia Quick Evaluation

## 2018-05-25 ENCOUNTER — Encounter: Payer: Self-pay | Admitting: Gastroenterology

## 2018-08-13 DIAGNOSIS — H6121 Impacted cerumen, right ear: Secondary | ICD-10-CM | POA: Diagnosis not present

## 2018-08-25 DIAGNOSIS — L570 Actinic keratosis: Secondary | ICD-10-CM | POA: Diagnosis not present

## 2018-08-25 DIAGNOSIS — X32XXXA Exposure to sunlight, initial encounter: Secondary | ICD-10-CM | POA: Diagnosis not present

## 2018-08-25 DIAGNOSIS — D2272 Melanocytic nevi of left lower limb, including hip: Secondary | ICD-10-CM | POA: Diagnosis not present

## 2018-08-25 DIAGNOSIS — Z85828 Personal history of other malignant neoplasm of skin: Secondary | ICD-10-CM | POA: Diagnosis not present

## 2018-08-25 DIAGNOSIS — D2262 Melanocytic nevi of left upper limb, including shoulder: Secondary | ICD-10-CM | POA: Diagnosis not present

## 2018-08-25 DIAGNOSIS — D2261 Melanocytic nevi of right upper limb, including shoulder: Secondary | ICD-10-CM | POA: Diagnosis not present

## 2019-01-08 ENCOUNTER — Other Ambulatory Visit: Payer: Self-pay | Admitting: Emergency Medicine

## 2019-01-08 ENCOUNTER — Ambulatory Visit: Payer: Self-pay

## 2019-01-08 NOTE — Telephone Encounter (Signed)
Patient called and asks what are the effects she will need to look for if the Fosamax is not refilled by the doctor until her appointment in March with Dr. Katrinka Blazing. She says she called Dr. Michaelle Copas office and they told her they would not be able to refill it, because the medication is not in their system. She says she has one pill to take next week, but then will not have any for 6 weeks. She's asking will it do her body harm to be without 6 weeks. I advised that I could not find anything in the literature and to call the pharmacist to see if he/she can give her the answer. I advised that I will send to the provider at [facility] for refill authorization.  Reason for Disposition . Caller has NON-URGENT medication question about med that PCP prescribed and triager unable to answer question  Protocols used: MEDICATION QUESTION CALL-A-AH

## 2019-01-08 NOTE — Addendum Note (Signed)
Addended by: Wilford Corner on: 01/08/2019 03:45 PM   Modules accepted: Orders

## 2019-01-08 NOTE — Telephone Encounter (Signed)
Call placed to patient. Left VM to call office to schedule an establish care visit.Patient thought that last Rx sent in was for 1 yr but is going with Dr Katrinka Blazing to the new practice asking for a one time refill please

## 2019-01-08 NOTE — Telephone Encounter (Signed)
Requested medication (s) are due for refill today: Yes  Requested medication (s) are on the active medication list: Yes  Last refill:  01/2018  Future visit scheduled: Scheduled with Dr. Nilda Simmer on February 26, 2019  Notes to clinic: She's asking for enough until her appointment with Dr. Katrinka Blazing at her new location, patient has 1 pill to take next week and will be out. Please send to provider for approval.     Requested Prescriptions  Pending Prescriptions Disp Refills   alendronate (FOSAMAX) 70 MG tablet 12 tablet 3    Sig: Take 1 tablet (70 mg total) by mouth once a week.     Endocrinology:  Bisphosphonates Failed - 01/08/2019  3:45 PM      Failed - Vit D in normal range and within 360 days    Vit D, 25-Hydroxy  Date Value Ref Range Status  02/19/2017 31.6 30.0 - 100.0 ng/mL Final    Comment:    Vitamin D deficiency has been defined by the Institute of Medicine and an Endocrine Society practice guideline as a level of serum 25-OH vitamin D less than 20 ng/mL (1,2). The Endocrine Society went on to further define vitamin D insufficiency as a level between 21 and 29 ng/mL (2). 1. IOM (Institute of Medicine). 2010. Dietary reference    intakes for calcium and D. [place]: The    [employer]. 2. Holick MF, Binkley [place], Bischoff-Ferrari HA, et al.    Evaluation, treatment, and prevention of vitamin D    deficiency: an Endocrine Society clinical practice    guideline. JCEM. 2011 Jul; 96(7):1911-30.          Passed - Ca in normal range and within 360 days    Calcium  Date Value Ref Range Status  01/28/2018 10.1 8.7 - 10.3 mg/dL Final         Passed - Valid encounter within last 12 months    Recent Outpatient Visits          11 months ago Annual physical exam   Primary Care at [facility], [place], [place]   1 year ago Acute pain of left shoulder   Primary Care at [facility], Myrle Sheng, [place]   1 year ago Routine physical examination   Primary Care at [facility], Myrle Sheng, [place]   1 year ago Hip sprain, left, subsequent encounter   Primary Care at [facility], Myrle Sheng, [place]   1 year ago Fall, initial encounter   Primary Care at [facility], [place], [place]           Refused Prescriptions Disp Refills   alendronate (FOSAMAX) 70 MG tablet 12 tablet 3    Sig: Take 1 tablet (70 mg total) by mouth once a week.     Endocrinology:  Bisphosphonates Failed - 01/08/2019  3:45 PM      Failed - Vit D in normal range and within 360 days    Vit D, 25-Hydroxy  Date Value Ref Range Status  02/19/2017 31.6 30.0 - 100.0 ng/mL Final    Comment:    Vitamin D deficiency has been defined by the Institute of Medicine and an Endocrine Society practice guideline as a level of serum 25-OH vitamin D less than 20 ng/mL (1,2). The Endocrine Society went on to further define vitamin D insufficiency as a level between 21 and 29 ng/mL (2). 1. IOM (Institute of Medicine). 2010. Dietary reference    intakes for calcium and D. [place]: The  [employer]. 2. Holick MF, Binkley [place], Bischoff-Ferrari HA, et al.    Evaluation, treatment, and prevention of vitamin D    deficiency: an Endocrine Society clinical practice    guideline. JCEM. 2011 Jul; 96(7):1911-30.          Passed - Ca in normal range and within 360 days    Calcium  Date Value Ref Range Status  01/28/2018 10.1 8.7 - 10.3 mg/dL Final         Passed - Valid encounter within last 12 months    Recent Outpatient Visits          11 months ago Annual physical exam   Primary Care at [facility], [place], [place]   1 year ago Acute pain of left shoulder   Primary Care at [facility], Myrle Sheng, [place]   1 year ago Routine physical examination   Primary Care at [facility], Myrle Sheng, MD   1 year ago Hip sprain, left, subsequent encounter   Primary Care at [facility], Myrle Sheng, [place]   1 year ago Fall, initial encounter   Primary Care at [facility], [place], [place]

## 2019-01-08 NOTE — Telephone Encounter (Signed)
Patient is calling back in regards to this and states she will be out of it for 6 weeks. She would like this refilled one time until she sees Dr. Katrinka Blazing.

## 2019-01-08 NOTE — Telephone Encounter (Signed)
Copied from CRM [phone]. Topic: Quick Communication - Rx Refill/Question >> Jan 08, 2019 11:01 AM Jaquita Rectoravis, Karen A wrote: Medication: alendronate (FOSAMAX) 70 MG tablet  Patient thought that last Rx sent in was for 1 yr but is going with Dr Katrinka BlazingSmith to the new practice asking for a one time refill please  Has the patient contacted their pharmacy? Yes.   (Agent: If no, request that the patient contact the pharmacy for the refill.) (Agent: If yes, when and what did the pharmacy advise?)  Preferred Pharmacy (with phone number or street name): [id number] [place] [place]- [place], [place] - [address] [facility] [address] [phone] (Phone) [phone] (Fax)    Agent: Please be advised that RX refills may take up to 3 business days. We ask that you follow-up with your pharmacy.

## 2019-01-11 NOTE — Addendum Note (Signed)
Addended by: Anselm Pancoast R on: 01/11/2019 04:02 PM   Modules accepted: Orders

## 2019-01-15 NOTE — Telephone Encounter (Signed)
Please advise as I am not sure how to address this issue

## 2019-02-26 ENCOUNTER — Other Ambulatory Visit: Payer: Self-pay | Admitting: Family Medicine

## 2019-02-26 DIAGNOSIS — M816 Localized osteoporosis [Lequesne]: Secondary | ICD-10-CM | POA: Diagnosis not present

## 2019-02-26 DIAGNOSIS — E2839 Other primary ovarian failure: Secondary | ICD-10-CM

## 2019-02-26 DIAGNOSIS — Z13220 Encounter for screening for lipoid disorders: Secondary | ICD-10-CM | POA: Diagnosis not present

## 2019-02-26 DIAGNOSIS — Z780 Asymptomatic menopausal state: Secondary | ICD-10-CM

## 2019-02-26 DIAGNOSIS — Z1231 Encounter for screening mammogram for malignant neoplasm of breast: Secondary | ICD-10-CM

## 2019-02-26 DIAGNOSIS — Z0000 Encounter for general adult medical examination without abnormal findings: Secondary | ICD-10-CM | POA: Diagnosis not present

## 2019-02-26 DIAGNOSIS — Z131 Encounter for screening for diabetes mellitus: Secondary | ICD-10-CM | POA: Diagnosis not present

## 2019-02-26 DIAGNOSIS — Z23 Encounter for immunization: Secondary | ICD-10-CM | POA: Diagnosis not present

## 2019-03-02 ENCOUNTER — Other Ambulatory Visit: Payer: Self-pay

## 2019-03-02 ENCOUNTER — Emergency Department: Payer: BLUE CROSS/BLUE SHIELD

## 2019-03-02 ENCOUNTER — Encounter: Payer: Self-pay | Admitting: Emergency Medicine

## 2019-03-02 ENCOUNTER — Emergency Department
Admission: EM | Admit: 2019-03-02 | Discharge: 2019-03-02 | Disposition: A | Discharge status: Home / Self Care | Payer: BLUE CROSS/BLUE SHIELD | Attending: Emergency Medicine | Admitting: Emergency Medicine

## 2019-03-02 DIAGNOSIS — J449 Chronic obstructive pulmonary disease, unspecified: Secondary | ICD-10-CM | POA: Diagnosis not present

## 2019-03-02 DIAGNOSIS — R55 Syncope and collapse: Secondary | ICD-10-CM | POA: Diagnosis not present

## 2019-03-02 DIAGNOSIS — Z79899 Other long term (current) drug therapy: Secondary | ICD-10-CM | POA: Diagnosis not present

## 2019-03-02 LAB — CBC
HEMATOCRIT: 45.9 % (ref 36.0–46.0)
Hemoglobin: 15.2 g/dL — ABNORMAL HIGH (ref 12.0–15.0)
MCH: 31.2 pg (ref 26.0–34.0)
MCHC: 33.1 g/dL (ref 30.0–36.0)
MCV: 94.3 fL (ref 80.0–100.0)
Platelets: 534 10*3/uL — ABNORMAL HIGH (ref 150–400)
RBC: 4.87 MIL/uL (ref 3.87–5.11)
RDW: 12.9 % (ref 11.5–15.5)
WBC: 10.7 10*3/uL — ABNORMAL HIGH (ref 4.0–10.5)
nRBC: 0 % (ref 0.0–0.2)

## 2019-03-02 LAB — BASIC METABOLIC PANEL
ANION GAP: 12 (ref 5–15)
BUN: 18 mg/dL (ref 8–23)
CO2: 24 mmol/L (ref 22–32)
CREATININE: 0.72 mg/dL (ref 0.44–1.00)
Calcium: 8.9 mg/dL (ref 8.9–10.3)
Chloride: 102 mmol/L (ref 98–111)
GFR calc Af Amer: >60 mL/min (ref >60)
GLUCOSE: 115 mg/dL — AB (ref 70–99)
Potassium: 3.8 mmol/L (ref 3.5–5.1)
SODIUM: 138 mmol/L (ref 135–145)

## 2019-03-02 LAB — TROPONIN I
Troponin I: <0.03 ng/mL (ref <0.03)
Troponin I: <0.03 ng/mL (ref <0.03)

## 2019-03-02 LAB — GLUCOSE, CAPILLARY: Glucose-Capillary: 82 mg/dL (ref 70–99)

## 2019-03-02 NOTE — ED Provider Notes (Signed)
[facility] Emergency Department Provider Note ____________________________________________   First MD Initiated Contact with Patient 03/02/19 1919     (approximate)  I have reviewed the triage vital signs and the nursing notes.   HISTORY  Chief Complaint Near Syncope    HPI Mary Kirk is a 63 y.o. female with PMH as noted below and no significant cardiac history who presents with several episodes of apparent lightheadedness and near syncope earlier today.  The patient states that while sitting she started to feel a burning sensation in her abdomen that then radiated up to her chest and towards her head.  She then began feeling lightheaded although she did not actually pass out.  The burning sensation resolved after several seconds and the lightheadedness after few minutes.  This happened several more times between 10 AM and 3 PM today.  On 3:30 PM the patient had another episode but this was mainly in her chest and felt sort of like fluttering.  It also resolved after a brief amount of time.  She states she ate shortly after this and has not had an episode since.  The patient denies any actual chest pain.  She has no shortness of breath, cough, or fever.  No prior history of similar symptoms.  The patient is asymptomatic now does not feel lightheaded when she gets up or walks around.  Past Medical History:  Diagnosis Date  . Arthritis   . GERD (gastroesophageal reflux disease)   . IBS (irritable bowel syndrome)    Constipation predominant  . Osteoporosis     Patient Active Problem List   Diagnosis Date Noted  . Encounter for screening colonoscopy   . Hyperlipidemia 01/28/2018  . Body mass index (BMI) 19.9 or less, adult 01/28/2018  . Dyspareunia in female 01/28/2018  . Osteoarthritis of both hands 01/28/2018  . Localized osteoporosis without current pathological fracture 09/14/2017  . Gastroesophageal reflux disease without esophagitis  09/14/2017    Past Surgical History:  Procedure Laterality Date  . BUNIONECTOMY Bilateral   . COLONOSCOPY WITH PROPOFOL N/A 05/22/2018   Procedure: COLONOSCOPY WITH PROPOFOL;  Surgeon: Midge Minium, MD;  Location: [facility];  Service: Endoscopy;  Laterality: N/A;    Prior to Admission medications   Medication Sig Start Date End Date Taking? Authorizing Provider  acetaminophen (TYLENOL) 325 MG tablet Take 650 mg by mouth every 6 (six) hours as needed.    [provider]  alendronate (FOSAMAX) 70 MG tablet Take 1 tablet (70 mg total) by mouth once a week. 01/30/18   Porfirio Oar, PA  aspirin 81 MG chewable tablet Chew by mouth daily.    [provider]  Calcium Carb-Cholecalciferol (CALCIUM 600 + D PO) Take 600 mg by mouth 2 (two) times daily.    [provider]  estradiol (ESTRACE) 0.1 MG/GM vaginal cream Place vaginally. 02/19/17   [provider]  ranitidine (ZANTAC) 150 MG tablet Take 150 mg by mouth 2 (two) times daily.    [provider]    Allergies Codeine  Family History  Problem Relation Age of Onset  . Diabetes Mother   . Heart disease Mother   . Arthritis Father   . Hyperlipidemia Father   . Hypertension Father   . Stroke Father 60       CVA at age 31; recurrent CVA age 34.  . Dementia Father   . Breast cancer Maternal Grandmother 41    Social History Social History   Tobacco Use  .  Smoking status: Never Smoker  . Smokeless tobacco: Never Used  Substance Use Topics  . Alcohol use: Yes    Alcohol/week: 10.0 standard drinks    Types: 10 Glasses of wine per week    Comment: wine  . Drug use: No    Review of Systems  Constitutional: No fever. Eyes: No visual changes. ENT: No sore throat. Cardiovascular: Denies chest pain. Respiratory: Denies shortness of breath. Gastrointestinal: No nausea or vomiting. Genitourinary: Negative for dysuria.  Musculoskeletal: Negative for back pain. Skin: Negative for  rash. Neurological: Negative for headache.   ____________________________________________   PHYSICAL EXAM:  VITAL SIGNS: ED Triage Vitals [03/02/19 1828]  Enc Vitals Group     BP 139/81     Pulse Rate 67     Resp 16     Temp 98.2 F (36.8 C)     Temp Source Oral     SpO2 100 %     Weight 105 lb (47.6 kg)     Height 5\' 5"  (1.651 m)     Head Circumference      Peak Flow      Pain Score 0     Pain Loc      Pain Edu?      Excl. in GC?     Constitutional: Alert and oriented. Well appearing and in no acute distress. Eyes: Conjunctivae are normal.  Head: Atraumatic. Nose: No congestion/rhinnorhea. Mouth/Throat: Mucous membranes are moist.   Neck: Normal range of motion.  Cardiovascular: Good peripheral circulation. Respiratory: Normal respiratory effort.  No retractions.  Gastrointestinal: No distention.  Musculoskeletal: No lower extremity edema.  Extremities warm and well perfused.  Neurologic:  Normal speech and language.  Motor and sensory intact in all extremities.  Normal coordination with no ataxia on finger-to-nose.  Normal gait.  No gross focal neurologic deficits are appreciated.  Skin:  Skin is warm and dry. No rash noted. Psychiatric: Mood and affect are normal. Speech and behavior are normal.  ____________________________________________   LABS (all labs ordered are listed, but only abnormal results are displayed)  Labs Reviewed  BASIC METABOLIC PANEL - Abnormal; Notable for the following components:      Result Value   Glucose, Bld 115 (*)    All other components within normal limits  CBC - Abnormal; Notable for the following components:   WBC 10.7 (*)    Hemoglobin 15.2 (*)    Platelets 534 (*)    All other components within normal limits  TROPONIN I  GLUCOSE, CAPILLARY   ____________________________________________  EKG  ED ECG REPORT I, Dionne Bucy, the attending physician, personally viewed and interpreted this ECG.  Date:  03/02/2019 EKG Time: 1820 Rate: 74 Rhythm: normal sinus rhythm QRS Axis: normal Intervals: normal ST/T Wave abnormalities: Nonspecific lateral ST segment flattening Narrative Interpretation: Nonspecific abnormalities, possible poor baseline; change when compared to EKG of 02/19/2017   ED ECG REPORT I, Dionne Bucy, the attending physician, personally viewed and interpreted this ECG.  Date: 03/02/2019 EKG Time: 2004 Rate: 64 Rhythm: normal sinus rhythm QRS Axis: normal Intervals: normal ST/T Wave abnormalities: normal Narrative Interpretation: no evidence of acute ischemia no change when compared to EKG of 02/19/2017   ____________________________________________  RADIOLOGY  CXR: No focal infiltrate or other acute abnormality  ____________________________________________   PROCEDURES  Procedure(s) performed: No  Procedures  Critical Care performed: No ____________________________________________   INITIAL IMPRESSION / ASSESSMENT AND PLAN / ED COURSE  Pertinent labs & imaging results that were available during my care  of the patient were reviewed by me and considered in my medical decision making (see chart for details).  63 year old female with PMH as noted above but no significant prior cardiac history presents with several episodes earlier today of a burning sensation in her abdomen and chest with associated lightheadedness.  She had a final episode around 3:30 PM, but after eating lunch has not had any further episodes.  At this time she is asymptomatic.  I reviewed the past medical records in Epic.  The patient has no recent prior ED visits or admissions.  On exam, the patient is very well-appearing.  Her vital signs are normal.  Neuro exam is nonfocal.  The remainder of the exam is unremarkable.  Overall, given the brief duration of the episodes and the fact that they resolved after the patient ate I suspect dehydration, hypoglycemia, vasovagal near syncope,  or other benign etiology.  There is no clinical evidence to suggest arrhythmia or other cardiac etiology, and the patient has no focal neuro findings or evidence of acute CNS cause.  Initial lab work-up including troponin is negative.  The patient's initial x-ray here was slightly abnormal with inferior T wave inversions and slight ST segment flattening laterally.  This differed from her last EKG in our system from 2018.  However repeat EKG is normal and not changed from the prior EKG.  The patient was having no symptoms when the initial EKG was taken, and no evidence to suggest ACS.  The baseline on the initial EKG appears regular, and I suspect that this abnormality was either due to the poor baseline or some other lead placement or artifactual issue.  It is also possible that it could be a mislabeled EKG because of how different the overall morphology appears.  At this time, we will obtain a repeat troponin to more thoroughly rule out ACS.  If this is negative and the patient remains asymptomatic, I plan for discharge home with PMD follow-up.  ----------------------------------------- 8:29 PM on 03/02/2019 -----------------------------------------  Patient is pending repeat troponin.  I am signing out to the oncoming physician Dr. Alphonzo Lemmings.  Plan will be discharged home if it is negative.  ____________________________________________   FINAL CLINICAL IMPRESSION(S) / ED DIAGNOSES  Final diagnoses:  Near syncope      NEW MEDICATIONS STARTED DURING THIS VISIT:  New Prescriptions   No medications on file     Note:  This document was prepared using Dragon voice recognition software and may include unintentional dictation errors.    Dionne Bucy, MD 03/02/19 2029

## 2019-03-02 NOTE — ED Triage Notes (Signed)
PT arrives from home with concerns over near syncope episodes that started today. Pt reports first episode started at "1030-11am" and it was the first of "probably five." PT states the episodes start at a burning sensation in her abdomen and radiate to her chest where they become a discomfort and continue radiating to her head. Pt states once they reach her head she feels as though she is "going to pass out." Pt states the first two episodes radiated to her head and the last radiated to her back. Episodes are reported to last "seconds." No loc reported. In triage pt & a x 4. No neuro deficits in triage.

## 2019-03-02 NOTE — Discharge Instructions (Signed)
Be sure to eat regularly throughout the day and stay well-hydrated.  Make an appointment to follow-up with your primary care doctor.  Return to the ER immediately for new, worsening, or persistent weakness or lightheadedness, feeling like you are going to pass out, chest pain or discomfort, fever, shortness of breath, or any other new or worsening symptoms that concern you.

## 2019-05-26 DIAGNOSIS — Z23 Encounter for immunization: Secondary | ICD-10-CM | POA: Diagnosis not present

## 2019-06-15 ENCOUNTER — Ambulatory Visit
Admission: RE | Admit: 2019-06-15 | Discharge: 2019-06-15 | Disposition: A | Discharge status: Home / Self Care | Payer: BC Managed Care – PPO | Source: Ambulatory Visit | Attending: Family Medicine | Admitting: Family Medicine

## 2019-06-15 ENCOUNTER — Other Ambulatory Visit: Payer: Self-pay

## 2019-06-15 DIAGNOSIS — M810 Age-related osteoporosis without current pathological fracture: Secondary | ICD-10-CM | POA: Diagnosis not present

## 2019-06-15 DIAGNOSIS — Z1231 Encounter for screening mammogram for malignant neoplasm of breast: Secondary | ICD-10-CM | POA: Diagnosis not present

## 2019-06-15 DIAGNOSIS — Z780 Asymptomatic menopausal state: Secondary | ICD-10-CM | POA: Diagnosis not present

## 2019-06-15 DIAGNOSIS — M816 Localized osteoporosis [Lequesne]: Secondary | ICD-10-CM | POA: Diagnosis not present

## 2019-06-15 DIAGNOSIS — E2839 Other primary ovarian failure: Secondary | ICD-10-CM | POA: Diagnosis not present

## 2020-09-22 ENCOUNTER — Other Ambulatory Visit: Payer: Self-pay | Admitting: Family Medicine

## 2020-09-22 DIAGNOSIS — Z1231 Encounter for screening mammogram for malignant neoplasm of breast: Secondary | ICD-10-CM

## 2020-10-24 ENCOUNTER — Ambulatory Visit
Admission: RE | Admit: 2020-10-24 | Discharge: 2020-10-24 | Disposition: A | Discharge status: Home / Self Care | Payer: BC Managed Care – PPO | Source: Ambulatory Visit | Attending: Family Medicine | Admitting: Family Medicine

## 2020-10-24 ENCOUNTER — Other Ambulatory Visit: Payer: Self-pay

## 2020-10-24 DIAGNOSIS — Z1231 Encounter for screening mammogram for malignant neoplasm of breast: Secondary | ICD-10-CM | POA: Diagnosis present

## 2021-02-08 ENCOUNTER — Other Ambulatory Visit: Payer: Self-pay | Admitting: Family Medicine

## 2021-02-08 DIAGNOSIS — R1011 Right upper quadrant pain: Secondary | ICD-10-CM

## 2021-02-16 ENCOUNTER — Other Ambulatory Visit: Payer: Self-pay

## 2021-02-16 ENCOUNTER — Ambulatory Visit
Admission: RE | Admit: 2021-02-16 | Discharge: 2021-02-16 | Disposition: A | Discharge status: Home / Self Care | Payer: BC Managed Care – PPO | Source: Ambulatory Visit | Attending: Family Medicine | Admitting: Family Medicine

## 2021-02-16 ENCOUNTER — Ambulatory Visit: Payer: BC Managed Care – PPO

## 2021-02-16 DIAGNOSIS — R1011 Right upper quadrant pain: Secondary | ICD-10-CM | POA: Diagnosis present

## 2021-04-03 ENCOUNTER — Ambulatory Visit: Payer: BC Managed Care – PPO | Attending: Family Medicine | Admitting: Physical Therapy

## 2021-04-03 ENCOUNTER — Other Ambulatory Visit: Payer: Self-pay

## 2021-04-03 ENCOUNTER — Encounter: Payer: Self-pay | Admitting: Physical Therapy

## 2021-04-03 DIAGNOSIS — R2689 Other abnormalities of gait and mobility: Secondary | ICD-10-CM | POA: Insufficient documentation

## 2021-04-03 DIAGNOSIS — R278 Other lack of coordination: Secondary | ICD-10-CM

## 2021-04-03 DIAGNOSIS — R151 Fecal smearing: Secondary | ICD-10-CM | POA: Diagnosis present

## 2021-04-03 NOTE — Patient Instructions (Addendum)
  Clam Shell 45 Degrees  Lying with hips and knees bent 45, one pillow between knees and ankles. Heel together, toes apart like ballerina,  Lift knee with exhale while pressing heels together. Be sure pelvis does not roll backward. Do not arch back. Do 20 times, each leg, 2 times per day.     Complimentary stretch: Figure-4  5 breath   ___  Sitting without crossing legs,  Focus on 4 points of contact at feet and pelvis ( sitting bones)

## 2021-04-04 NOTE — Therapy (Signed)
[facility] MAIN [facility] SERVICES [address] [place], [place], [address] Phone: [phone]   Fax:  [phone]  Physical Therapy Evaluation  Patient Details  Name: Mary Kirk MRN: [id number] Date of Birth: [birth year] Referring Provider (PT): Nilda Simmer MD   Encounter Date: 04/03/2021   PT End of Session - 04/03/21 1325    Visit Number 1    Number of Visits 10    Date for PT Re-Evaluation 06/12/21    PT Start Time 1306    PT Stop Time 1400    PT Time Calculation (min) 54 min           Past Medical History:  Diagnosis Date  . Arthritis   . GERD (gastroesophageal reflux disease)   . IBS (irritable bowel syndrome)    Constipation predominant  . Osteoporosis     Past Surgical History:  Procedure Laterality Date  . BUNIONECTOMY Bilateral   . COLONOSCOPY WITH PROPOFOL N/A 05/22/2018   Procedure: COLONOSCOPY WITH PROPOFOL;  Surgeon: Midge Minium, MD;  Location: [facility];  Service: Endoscopy;  Laterality: N/A;    There were no vitals filed for this visit.    Subjective Assessment - 04/03/21 1326    Subjective 1) constipation: Pt reports constipation all her adult life. 2-3 x in the morning per day when taking  Miralax ( 2 x day). Bristol Stool Type 4 75% of the time, Type 5 - 25% of the time. When she get constipated and do not take Miralax, it hurts her stomach. Daily fluid intake: water- 54 -60 fl oz ( increased to this amount since [id number]), ginger tea - 8 fl oz, alchohol- wine 8 fl oz. Pt has not worked with a nutritionist. Pt noticed starchy vegetables makes it worse. Carrots, brussell sprouts, califlower makes it better.    2) fecal leakage occuring when taking Miralax 2 x day. When she was taking it once a da, the leakage rarely occured   3) LBP since her 30s. First injury occured in an exercise class. Pain 2-3/10 on average, 9/10 at worst with reaching forward or "when I am not thinking about it".  Denied radiating  pain. Pain loccurs on L side mostly but it occurs on the R sometimes.   3) painful intercourse and gynecological exams.    4) tailbone / rectal pain that started occuring 3 years ago. It feels like pulling and almost "like she has to go to the bathroom":  It aches like a toothache. If she puts pressure there, it feels better    Pertinent History 1 vaginal deliveries with tearing, Physical fitness routine: in the summer, water skiing, used to run all the time, walk 2 miles 5 times a week. Hx fof osteoporosis, arthritis    Patient Stated Goals be able to go to the toilet and take less Miralax and have less painful intercourse              [facility] PT Assessment - 04/03/21 1342      Assessment   Medical Diagnosis fecal smearing    Referring Provider (PT) Nilda Simmer MD      Precautions   Precautions None   osteoporosis     Restrictions   Weight Bearing Restrictions No      Balance Screen   Has the patient fallen in the past 6 months No      Observation/Other Assessments   Observations crossed leg sitting posture      Strength  Overall Strength Comments R hip flex 3/5 L 4/5, knee flexion 3/5 B, knee ext 4/5  B, hip abduction R 3-/5, L 3+/5      Palpation   Spinal mobility R convex curve , R iliac crest higher    SI assessment  posterior tilt                      Objective measurements completed on examination: See above findings.     Pelvic Floor Special Questions - 04/03/21 1350    Diastasis Recti neg    External Perineal Exam abdominal use with pelvic floor contraction cue, relaxation of pelvic floor            [facility] Adult PT Treatment/Exercise - 04/04/21 1813      Therapeutic Activites    Other Therapeutic Activities explained Pelvic PT customized program, role of IAP system for improving Sx      Neuro Re-ed    Neuro Re-ed Details  cued for clam shells, proper sitting posture, proper body mechanics                       PT Long Term  Goals - 04/03/21 1340      PT LONG TERM GOAL #1   Title Pt will report taking Miralax once a day instead of 2x day and able to have less fecal leakage by 50% in order to improve QOL    Time 4    Period Weeks    Status New    Target Date 05/01/21      PT LONG TERM GOAL #2   Title Pt will complete a food diary    Time 2    Period Weeks    Status New    Target Date 04/17/21      PT LONG TERM GOAL #3   Title Pt will demo IND with body mechanics to minimzie straining pelvic floor    Time 6    Period Weeks    Status New    Target Date 05/15/21      PT LONG TERM GOAL #4   Title Pt will demo proper deep core technique and pelvic floor coordination to promote IAP system for psotural stability and fecal continence and less pain with intercourse and gynecological exam    Time 5    Period Weeks    Status New      PT LONG TERM GOAL #5   Title Pt will demo increased hip abduction strength to > 4/5 in order to  improve tailbone pain and pelvic alignment to minimize slumped sitting    Time 8    Period Weeks    Status New    Target Date 05/30/21      Additional Long Term Goals   Additional Long Term Goals Yes      PT LONG TERM GOAL #6   Title Pt will demo proper alignment of coccyx/ SIJ and report no more pain with sitting not pulling pain at rectal area in order to improve QOL    Time 10    Period Weeks    Status New    Target Date 06/13/21      PT LONG TERM GOAL #7   Title Pt will report Bristol Stool Type 4 100% of the time in order to minimize straining and constipation    Baseline Bristol Stool Type 4 75% of the time, Type 5 - 25% of the time.  Time 10    Period Weeks    Status New                  Plan - 04/03/21 1325    Clinical Impression Statement  Pt is 65 yo who presents with constipation, tailbone pain, fecal incontinence, pain with sexual intercourse and gynecological exams.   Pt's musculoskeletal assessment revealed uneven pelvic girdle height, spinal  deviations at lumbar spine,  dyscoordination and strength of pelvic floor mm, weak hip weakness, poor body mechanics which places strain on the abdominal/pelvic floor mm.   These are deficits that indicate an ineffective intraabdominal pressure system associated with increased risk for pt's pelvic floor dysfunctions.   Pt was provided education on etiology of Sx with anatomy, physiology explanation with images along with the benefits of customized pelvic PT Tx based on pt's medical conditions and musculoskeletal deficits.  Explained the physiology of deep core mm coordination and roles of pelvic floor function in urination, defecation, sexual function, and postural control with deep core mm system.   Following Tx today which pt tolerated without complaints, pt demo'd proper sitting posture with not crossing her legs and proper technique for hip abduction strengthening. Plan to address spinal deviations and pelvic obliquity and assess tailbone next session. Pt benefits from skilled PT.     Stability/Clinical Decision Making Evolving/Moderate complexity    Clinical Decision Making Moderate    Rehab Potential Good    PT Frequency 1x / week    PT Duration Other (comment)   10   PT Treatment/Interventions ADLs/Self Care Home Management;Traction;Moist Heat;Therapeutic activities;Functional mobility training;Therapeutic exercise;Gait training;Neuromuscular re-education;Patient/family education;Manual techniques;Manual lymph drainage;Taping;Energy conservation;Scar mobilization;Balance training;Spinal Manipulations;Dry needling;Cryotherapy    Consulted and Agree with Plan of Care Patient           Patient will benefit from skilled therapeutic intervention in order to improve the following deficits and impairments:  Abnormal gait,Decreased coordination,Decreased endurance,Difficulty walking,Decreased range of motion,Decreased strength,Decreased scar mobility,Decreased mobility,Increased muscle  spasms,Improper body mechanics,Increased fascial restricitons,Postural dysfunction,Hypomobility,Decreased safety awareness,Decreased balance,Decreased activity tolerance,Impaired flexibility,Pain  Visit Diagnosis: Fecal smearing  Other lack of coordination  Other abnormalities of gait and mobility     Problem List Patient Active Problem List   Diagnosis Date Noted  . Encounter for screening colonoscopy   . Hyperlipidemia 01/28/2018  . Body mass index (BMI) 19.9 or less, adult 01/28/2018  . Dyspareunia in female 01/28/2018  . Osteoarthritis of both hands 01/28/2018  . Localized osteoporosis without current pathological fracture 09/14/2017  . Gastroesophageal reflux disease without esophagitis 09/14/2017    Mariane Masters ,PT, DPT, E-RYT  04/04/2021, 6:21 PM  [facility] MAIN [facility] SERVICES [address] [place], [place], [address] Phone: [phone]   Fax:  [phone]  Name: Mary Kirk MRN: [id number] Date of Birth: 1956/01/05

## 2021-04-10 ENCOUNTER — Ambulatory Visit: Payer: BC Managed Care – PPO | Admitting: Physical Therapy

## 2021-04-10 DIAGNOSIS — R2689 Other abnormalities of gait and mobility: Secondary | ICD-10-CM

## 2021-04-10 DIAGNOSIS — R278 Other lack of coordination: Secondary | ICD-10-CM

## 2021-04-10 DIAGNOSIS — R151 Fecal smearing: Secondary | ICD-10-CM

## 2021-04-10 NOTE — Patient Instructions (Signed)
Stretch for pelvic floor     On belly: Riding horse edge of mattress  knee bent like riding a horse, move knee towards armpit and out  10 reps       Frog stretch:  laying on belly with pillow under hips, knees bent, inhale do nothing, exhale let ankles fall apart   20 reps   Prone Heel Press for strengthening sacro-iliac joints  1. Lie on your belly. If you have an arch in your low back or it feels umcomfortable, place a pillow under your low belly/hips to make sure your low back feel comfortable.   2. Place our forehead on top of your palms.      Widen your knees apart for starting position.   3. Inhale, feel belly and low back expand  4. Exhale, feel belly hug in, press heel together and count aloud for 5 sec. Then relax the heel squeezing.  Perform 20 reps of 5 sec holds. 2 sets/ day.    If you feel entire buttock tighten too much or feel low back pain, apply 50% less effort. As you press your heel together, you will feel as if your pubic bone (front of your pelvis) and sacrum (back of your pelvis) gentle move towards each other or your low abdominal muscles hug in more.  __  Body scan emailed to practice mindfulness and relaxation for optimal digestion

## 2021-04-10 NOTE — Therapy (Signed)
[facility] MAIN [facility] SERVICES [address] [place], [place], [address] Phone: [phone]   Fax:  [phone]  Physical Therapy Treatment  Patient Details  Name: Mary Kirk MRN: [id number] Date of Birth: 07/09/1956 Referring Provider (PT): Nilda Simmer MD   Encounter Date: 04/10/2021   PT End of Session - 04/10/21 1403    Visit Number 2    Number of Visits 10    Date for PT Re-Evaluation 06/12/21    PT Start Time 1300    PT Stop Time 1400    PT Time Calculation (min) 60 min           Past Medical History:  Diagnosis Date  . Arthritis   . GERD (gastroesophageal reflux disease)   . IBS (irritable bowel syndrome)    Constipation predominant  . Osteoporosis     Past Surgical History:  Procedure Laterality Date  . BUNIONECTOMY Bilateral   . COLONOSCOPY WITH PROPOFOL N/A 05/22/2018   Procedure: COLONOSCOPY WITH PROPOFOL;  Surgeon: Midge Minium, MD;  Location: [facility];  Service: Endoscopy;  Laterality: N/A;    There were no vitals filed for this visit.   Subjective Assessment - 04/10/21 1300    Subjective Pt feels straighter and is more conscious to not cross legs. Pt felt the figure 4 stretch was tougher to do and it has limber up some.    Pertinent History 1 vaginal deliveries with tearing, Physical fitness routine: in the summer, water skiing, used to run all the time, walk 2 miles 5 times a week. Hx fof osteoporosis, arthritis    Patient Stated Goals be able to go to the toilet and take less Miralax and have less painful intercourse              [facility] PT Assessment - 04/10/21 1306      AROM   Overall AROM Comments R figure 4 , 71 cm ( post Tx: 65 cm)  R 63 cm from floor to knee      Palpation   Spinal mobility R iliac crest is slightly lowered, R convex curve Lumbar    SI assessment  hyomobile sacrum/ lacking nutation, coccygeal mm tightness R > L                         [facility] Adult PT  Treatment/Exercise - 04/10/21 1403      Neuro Re-ed    Neuro Re-ed Details  cued for new HEP for SIJ and coccyx mobility      Moist Heat Therapy   Number Minutes Moist Heat 5 Minutes    Moist Heat Location --   sacrum during instruction for HEP     Manual Therapy   Manual therapy comments long axis distraction BLE,  coccygeus R STM, PA mob femurhead to promote hip ext , PA Grade III Mob at sacrum                       PT Long Term Goals - 04/10/21 1316      PT LONG TERM GOAL #1   Title Pt will report taking Miralax once a day instead of 2x day and able to have less fecal leakage by 50% in order to improve QOL    Time 4    Period Weeks    Status On-going      PT LONG TERM GOAL #2   Title Pt will complete a  food diary    Time 2    Period Weeks    Status On-going      PT LONG TERM GOAL #3   Title Pt will demo IND with body mechanics to minimzie straining pelvic floor    Time 6    Period Weeks    Status On-going      PT LONG TERM GOAL #4   Title Pt will demo proper deep core technique and pelvic floor coordination to promote IAP system for psotural stability and fecal continence and less pain with intercourse and gynecological exam    Time 5    Period Weeks    Status On-going      PT LONG TERM GOAL #5   Title Pt will demo increased hip abduction strength to > 4/5 in order to  improve tailbone pain and pelvic alignment to minimize slumped sitting    Time 8    Period Weeks    Status On-going      PT LONG TERM GOAL #6   Title Pt will demo proper alignment of coccyx/ SIJ and report no more pain with sitting not pulling pain at rectal area in order to improve QOL    Time 10    Period Weeks    Status On-going      PT LONG TERM GOAL #7   Title Pt will report Bristol Stool Type 4 100% of the time in order to minimize straining and constipation    Baseline Bristol Stool Type 4 75% of the time, Type 5 - 25% of the time.    Time 10    Period Weeks    Status  On-going      PT LONG TERM GOAL #8   Title Pt will improve hip abd/ER/flex ( figure-4 )AROM with R from lateral tibial plateau 71 cm,  L 63 cm from floor to knee to R to < 65 cm in order to sexual activities and pelvic exam and don/ dof shoes.    Time 6    Period Weeks    Status New    Target Date 05/22/21                 Plan - 04/10/21 1545    Clinical Impression Statement Pt showed good carry over with more levelled shoulders and less spinal curves. Manual Tx focused on mobilizing R SIJ and coccyx to promote more nutation which helped to improve hip abd/ER/flex mobility. Initiated relaxation training to help promote digestion and help prepare pt for pelvic floor training. Anticipate coccyx alignment and today's manual Tx will help with tailbone pain. Pt continues to benefit from skilled PT.    Stability/Clinical Decision Making Evolving/Moderate complexity    Rehab Potential Good    PT Frequency 1x / week    PT Duration Other (comment)   10   PT Treatment/Interventions ADLs/Self Care Home Management;Traction;Moist Heat;Therapeutic activities;Functional mobility training;Therapeutic exercise;Gait training;Neuromuscular re-education;Patient/family education;Manual techniques;Manual lymph drainage;Taping;Energy conservation;Scar mobilization;Balance training;Spinal Manipulations;Dry needling;Cryotherapy    Consulted and Agree with Plan of Care Patient           Patient will benefit from skilled therapeutic intervention in order to improve the following deficits and impairments:  Abnormal gait,Decreased coordination,Decreased endurance,Difficulty walking,Decreased range of motion,Decreased strength,Decreased scar mobility,Decreased mobility,Increased muscle spasms,Improper body mechanics,Increased fascial restricitons,Postural dysfunction,Hypomobility,Decreased safety awareness,Decreased balance,Decreased activity tolerance,Impaired flexibility,Pain  Visit Diagnosis: Other lack of  coordination  Fecal smearing  Other abnormalities of gait and mobility     Problem List Patient Active  Problem List   Diagnosis Date Noted  . Encounter for screening colonoscopy   . Hyperlipidemia 01/28/2018  . Body mass index (BMI) 19.9 or less, adult 01/28/2018  . Dyspareunia in female 01/28/2018  . Osteoarthritis of both hands 01/28/2018  . Localized osteoporosis without current pathological fracture 09/14/2017  . Gastroesophageal reflux disease without esophagitis 09/14/2017    Mariane Masters ,PT, DPT, E-RYT  04/10/2021, 3:45 PM  [facility] MAIN [facility] SERVICES [address] [place], [place], [address] Phone: [phone]   Fax:  [phone]  Name: Mary Kirk MRN: [id number] Date of Birth: 1956-07-30

## 2021-04-17 ENCOUNTER — Ambulatory Visit: Payer: BC Managed Care – PPO | Attending: Family Medicine | Admitting: Physical Therapy

## 2021-04-17 ENCOUNTER — Other Ambulatory Visit: Payer: Self-pay

## 2021-04-17 DIAGNOSIS — R278 Other lack of coordination: Secondary | ICD-10-CM | POA: Insufficient documentation

## 2021-04-17 DIAGNOSIS — M533 Sacrococcygeal disorders, not elsewhere classified: Secondary | ICD-10-CM | POA: Diagnosis not present

## 2021-04-17 DIAGNOSIS — R151 Fecal smearing: Secondary | ICD-10-CM | POA: Diagnosis present

## 2021-04-17 DIAGNOSIS — R2689 Other abnormalities of gait and mobility: Secondary | ICD-10-CM | POA: Diagnosis present

## 2021-04-17 NOTE — Patient Instructions (Signed)
childs poses rocking with pillow under belly if need more support/ relaxation  childs poses rocking   Toes tucked, shoulders down and back, on forearms , hands shoulder width apart  10 reps   __    Side of hip stretch:  Reclined twist for hips and side of the hips/ legs  Lay on your back, knees bend Scoot hips to the R , leave shoulders in place Drop knees to the L side resting onto pillows to keep leg at the same width of hips Pillow under L thigh to minimize too much strain   __

## 2021-04-17 NOTE — Therapy (Signed)
[facility] MAIN [facility] SERVICES [address] [place], [place], [address] Phone: [phone]   Fax:  [phone]  Physical Therapy Treatment  Patient Details  Name: Mary Kirk MRN: [id number] Date of Birth: 1956-01-22 Referring Provider (PT): Nilda Simmer MD   Encounter Date: 04/17/2021   PT End of Session - 04/17/21 1310    Visit Number 3    Number of Visits 10    Date for PT Re-Evaluation 06/12/21    PT Start Time 1302    PT Stop Time 1400    PT Time Calculation (min) 58 min           Past Medical History:  Diagnosis Date  . Arthritis   . GERD (gastroesophageal reflux disease)   . IBS (irritable bowel syndrome)    Constipation predominant  . Osteoporosis     Past Surgical History:  Procedure Laterality Date  . BUNIONECTOMY Bilateral   . COLONOSCOPY WITH PROPOFOL N/A 05/22/2018   Procedure: COLONOSCOPY WITH PROPOFOL;  Surgeon: Midge Minium, MD;  Location: [facility];  Service: Endoscopy;  Laterality: N/A;    There were no vitals filed for this visit.   Subjective Assessment - 04/17/21 1306    Subjective Pt reported she felt encouraged because after last session, she noticed her bowel movements doubled in size. Pt felt more finished with bowel movements. Pt had decreased fecal leakage from daily to 2 x week last week. Pt had gas and bloating this past weekend and she is trying to remember what she ate.    Pertinent History 1 vaginal deliveries with tearing, Physical fitness routine: in the summer, water skiing, used to run all the time, walk 2 miles 5 times a week. Hx fof osteoporosis, arthritis    Patient Stated Goals be able to go to the toilet and take less Miralax and have less painful intercourse              [facility] PT Assessment - 04/17/21 1317      Observation/Other Assessments   Scoliosis R shoulder slightly lowered, less spinal deviations      Strength   Overall Strength Comments BLE 4-/5            Palpation: L SIJ hypomobile, L medial scapular mm tightness               [facility] Adult PT Treatment/Exercise - 04/17/21 1319      Therapeutic Activites    Other Therapeutic Activities discussed completing food diary, referral to nutritionist / functional medicine MD      Neuro Re-ed    Neuro Re-ed Details  cued for customized stretches and new HEP in childs pose rocking      Moist Heat Therapy   Number Minutes Moist Heat 5 Minutes    Moist Heat Location --   sacrum in prone while discussing referring to nutritionist and functional medicine MDs     Manual Therapy   Manual therapy comments long axis distraction LLE, PA mob GRade III at L SIJ, STM/MWM at ischial rami, L medial scap                       PT Long Term Goals - 04/10/21 1316      PT LONG TERM GOAL #1   Title Pt will report taking Miralax once a day instead of 2x day and able to have less fecal leakage by 50% in order to improve QOL  Time 4    Period Weeks    Status On-going      PT LONG TERM GOAL #2   Title Pt will complete a food diary    Time 2    Period Weeks    Status On-going      PT LONG TERM GOAL #3   Title Pt will demo IND with body mechanics to minimzie straining pelvic floor    Time 6    Period Weeks    Status On-going      PT LONG TERM GOAL #4   Title Pt will demo proper deep core technique and pelvic floor coordination to promote IAP system for psotural stability and fecal continence and less pain with intercourse and gynecological exam    Time 5    Period Weeks    Status On-going      PT LONG TERM GOAL #5   Title Pt will demo increased hip abduction strength to > 4/5 in order to  improve tailbone pain and pelvic alignment to minimize slumped sitting    Time 8    Period Weeks    Status On-going      PT LONG TERM GOAL #6   Title Pt will demo proper alignment of coccyx/ SIJ and report no more pain with sitting not pulling pain at rectal area in order to improve QOL     Time 10    Period Weeks    Status On-going      PT LONG TERM GOAL #7   Title Pt will report Bristol Stool Type 4 100% of the time in order to minimize straining and constipation    Baseline Bristol Stool Type 4 75% of the time, Type 5 - 25% of the time.    Time 10    Period Weeks    Status On-going      PT LONG TERM GOAL #8   Title Pt will improve hip abd/ER/flex ( figure-4 )AROM with R from lateral tibial plateau 71 cm,  L 63 cm from floor to knee to R to < 65 cm in order to sexual activities and pelvic exam and don/ dof shoes.    Time 6    Period Weeks    Status New    Target Date 05/22/21                 Plan - 04/17/21 1310    Clinical Impression Statement After last session, she noticed her bowel movements doubled in size. Pt felt more finished with bowel movements. Pt had decreased fecal leakage from daily to 2 x week last week.   Pt's pelvic girdle height is even but R  Shoulder was still slighty lowered. Further manual Tx was applied to increase L SIJ mobilty and L medial scapula mm mobility. Customized stretches were added HEP to address her c/o of tightness at R low abdomen. This tightness is related to obliquity of SIJ which was addressed with manual Tx today.  Discussed and provided referral to nutritionist and functional medicine MD and the role of relaxation for digestive process. Pt continues to benefit from skilled PT. Plan to progress with strengthening at upcoming visits.     Stability/Clinical Decision Making Evolving/Moderate complexity    Rehab Potential Good    PT Frequency 1x / week    PT Duration Other (comment)   10   PT Treatment/Interventions ADLs/Self Care Home Management;Traction;Moist Heat;Therapeutic activities;Functional mobility training;Therapeutic exercise;Gait training;Neuromuscular re-education;Patient/family education;Manual techniques;Manual lymph drainage;Taping;Energy conservation;Scar mobilization;Balance training;Spinal  Manipulations;Dry needling;Cryotherapy    Consulted and Agree with Plan of Care Patient           Patient will benefit from skilled therapeutic intervention in order to improve the following deficits and impairments:  Abnormal gait,Decreased coordination,Decreased endurance,Difficulty walking,Decreased range of motion,Decreased strength,Decreased scar mobility,Decreased mobility,Increased muscle spasms,Improper body mechanics,Increased fascial restricitons,Postural dysfunction,Hypomobility,Decreased safety awareness,Decreased balance,Decreased activity tolerance,Impaired flexibility,Pain  Visit Diagnosis: Other lack of coordination  Fecal smearing  Other abnormalities of gait and mobility     Problem List Patient Active Problem List   Diagnosis Date Noted  . Encounter for screening colonoscopy   . Hyperlipidemia 01/28/2018  . Body mass index (BMI) 19.9 or less, adult 01/28/2018  . Dyspareunia in female 01/28/2018  . Osteoarthritis of both hands 01/28/2018  . Localized osteoporosis without current pathological fracture 09/14/2017  . Gastroesophageal reflux disease without esophagitis 09/14/2017    Mariane Masters ,PT, DPT, E-RYT  04/17/2021, 2:05 PM  [facility] MAIN [facility] SERVICES [address] [place], [place], [address] Phone: [phone]   Fax:  [phone]  Name: Mary Kirk MRN: [id number] Date of Birth: 03-27-1956

## 2021-04-24 ENCOUNTER — Ambulatory Visit: Payer: BC Managed Care – PPO | Admitting: Physical Therapy

## 2021-04-24 ENCOUNTER — Other Ambulatory Visit: Payer: Self-pay

## 2021-04-24 DIAGNOSIS — R278 Other lack of coordination: Secondary | ICD-10-CM | POA: Diagnosis not present

## 2021-04-24 DIAGNOSIS — R2689 Other abnormalities of gait and mobility: Secondary | ICD-10-CM

## 2021-04-24 DIAGNOSIS — R151 Fecal smearing: Secondary | ICD-10-CM

## 2021-04-24 NOTE — Patient Instructions (Signed)
Deep core    Colon massage R to upper R to upper L to l ower L of abdomen 3 x  Before and after meals for digestion and motility

## 2021-04-24 NOTE — Therapy (Signed)
[facility] MAIN [facility] SERVICES [address] [place], [place], [address] Phone: [phone]   Fax:  [phone]  Physical Therapy Treatment  Patient Details  Name: Mary Kirk MRN: [id number] Date of Birth: 06-20-1956 Referring Provider (PT): Nilda Simmer MD   Encounter Date: 04/24/2021   PT End of Session - 04/24/21 1345    Visit Number 4    Number of Visits 10    Date for PT Re-Evaluation 06/12/21    PT Start Time 1341    PT Stop Time 1436    PT Time Calculation (min) 55 min           Past Medical History:  Diagnosis Date  . Arthritis   . GERD (gastroesophageal reflux disease)   . IBS (irritable bowel syndrome)    Constipation predominant  . Osteoporosis     Past Surgical History:  Procedure Laterality Date  . BUNIONECTOMY Bilateral   . COLONOSCOPY WITH PROPOFOL N/A 05/22/2018   Procedure: COLONOSCOPY WITH PROPOFOL;  Surgeon: Midge Minium, MD;  Location: [facility];  Service: Endoscopy;  Laterality: N/A;    There were no vitals filed for this visit.   Subjective Assessment - 04/24/21 1344    Subjective Pt reported she had GI issues last week with bloating and gas and fecal leakage. But then it got better the following days. Pt had less leakage.    Pertinent History 1 vaginal deliveries with tearing, Physical fitness routine: in the summer, water skiing, used to run all the time, walk 2 miles 5 times a week. Hx fof osteoporosis, arthritis    Patient Stated Goals be able to go to the toilet and take less Miralax and have less painful intercourse              [facility] PT Assessment - 04/24/21 1434      Coordination   Coordination and Movement Description ab overuse with pelvic floor coordination      Palpation   Spinal mobility interspinal, med scapula, scalenes L                         [facility] Adult PT Treatment/Exercise - 04/24/21 1433      Neuro Re-ed    Neuro Re-ed Details  cued for deep  core and less ab overuse      Moist Heat Therapy   Number Minutes Moist Heat 5 Minutes    Moist Heat Location --   thoracic during HEP instruction     Manual Therapy   Manual therapy comments STM/MWM at intercostals, interspinals L thoracic                       PT Long Term Goals - 04/10/21 1316      PT LONG TERM GOAL #1   Title Pt will report taking Miralax once a day instead of 2x day and able to have less fecal leakage by 50% in order to improve QOL    Time 4    Period Weeks    Status On-going      PT LONG TERM GOAL #2   Title Pt will complete a food diary    Time 2    Period Weeks    Status On-going      PT LONG TERM GOAL #3   Title Pt will demo IND with body mechanics to minimzie straining pelvic floor    Time 6    Period  Weeks    Status On-going      PT LONG TERM GOAL #4   Title Pt will demo proper deep core technique and pelvic floor coordination to promote IAP system for psotural stability and fecal continence and less pain with intercourse and gynecological exam    Time 5    Period Weeks    Status On-going      PT LONG TERM GOAL #5   Title Pt will demo increased hip abduction strength to > 4/5 in order to  improve tailbone pain and pelvic alignment to minimize slumped sitting    Time 8    Period Weeks    Status On-going      PT LONG TERM GOAL #6   Title Pt will demo proper alignment of coccyx/ SIJ and report no more pain with sitting not pulling pain at rectal area in order to improve QOL    Time 10    Period Weeks    Status On-going      PT LONG TERM GOAL #7   Title Pt will report Bristol Stool Type 4 100% of the time in order to minimize straining and constipation    Baseline Bristol Stool Type 4 75% of the time, Type 5 - 25% of the time.    Time 10    Period Weeks    Status On-going      PT LONG TERM GOAL #8   Title Pt will improve hip abd/ER/flex ( figure-4 )AROM with R from lateral tibial plateau 71 cm,  L 63 cm from floor to knee  to R to < 65 cm in order to sexual activities and pelvic exam and don/ dof shoes.    Time 6    Period Weeks    Status New    Target Date 05/22/21                 Plan - 04/24/21 1436    Clinical Impression Statement Pt continues to demo more upright posture. Reviewed pt's food diary which included wholesome meals and snacks. Suggested quick oat meal as another source of fiber for breakfast.   Pt's spinal deviations and pelvic alignment are improving. Further addressed L upper quadrant which was higher than R. After manual Tx, pt demo'd imrproved L lateral/posterior excursion of diaphragm which deems her readiness for deep core coordination training. Pt required cues for less upper ab mm overuse. Pt continues to benefit from skilled PT. Plan to assess pfm at next session.        Stability/Clinical Decision Making Evolving/Moderate complexity    Rehab Potential Good    PT Frequency 1x / week    PT Duration Other (comment)   10   PT Treatment/Interventions ADLs/Self Care Home Management;Traction;Moist Heat;Therapeutic activities;Functional mobility training;Therapeutic exercise;Gait training;Neuromuscular re-education;Patient/family education;Manual techniques;Manual lymph drainage;Taping;Energy conservation;Scar mobilization;Balance training;Spinal Manipulations;Dry needling;Cryotherapy    Consulted and Agree with Plan of Care Patient           Patient will benefit from skilled therapeutic intervention in order to improve the following deficits and impairments:  Abnormal gait,Decreased coordination,Decreased endurance,Difficulty walking,Decreased range of motion,Decreased strength,Decreased scar mobility,Decreased mobility,Increased muscle spasms,Improper body mechanics,Increased fascial restricitons,Postural dysfunction,Hypomobility,Decreased safety awareness,Decreased balance,Decreased activity tolerance,Impaired flexibility,Pain  Visit Diagnosis: Other lack of  coordination  Fecal smearing  Other abnormalities of gait and mobility     Problem List Patient Active Problem List   Diagnosis Date Noted  . Encounter for screening colonoscopy   . Hyperlipidemia 01/28/2018  . Body mass index (  BMI) 19.9 or less, adult 01/28/2018  . Dyspareunia in female 01/28/2018  . Osteoarthritis of both hands 01/28/2018  . Localized osteoporosis without current pathological fracture 09/14/2017  . Gastroesophageal reflux disease without esophagitis 09/14/2017    Mariane Masters ,PT, DPT, E-RYT  04/24/2021, 3:48 PM  [facility] MAIN [facility] SERVICES [address] [place], [place], [address] Phone: [phone]   Fax:  [phone]  Name: Mary Kirk MRN: [id number] Date of Birth: 1956/12/16

## 2021-05-01 ENCOUNTER — Other Ambulatory Visit: Payer: Self-pay

## 2021-05-01 ENCOUNTER — Ambulatory Visit: Payer: BC Managed Care – PPO | Admitting: Physical Therapy

## 2021-05-01 DIAGNOSIS — R278 Other lack of coordination: Secondary | ICD-10-CM | POA: Diagnosis not present

## 2021-05-01 DIAGNOSIS — R151 Fecal smearing: Secondary | ICD-10-CM

## 2021-05-01 DIAGNOSIS — R2689 Other abnormalities of gait and mobility: Secondary | ICD-10-CM

## 2021-05-01 NOTE — Patient Instructions (Signed)
email body scan technique for relaxation  Stretch for pelvic floor   V- slides  "v heels slide away and then back toward buttocks and then rock knee to slight ,  slide heel along at 11 o clock away from buttocks   10 reps  ___

## 2021-05-01 NOTE — Therapy (Signed)
[place] MAIN [facility] SERVICES [address] [place], [place], [address] Phone: [phone]   Fax:  [phone]  Physical Therapy Treatment  Patient Details  Name: Mary Kirk MRN: [id number] Date of Birth: May 05, 1956 Referring Provider (PT): Reginia Forts MD   Encounter Date: 05/01/2021   PT End of Session - 05/01/21 1310    Visit Number 5    Number of Visits 10    Date for PT Re-Evaluation 06/12/21    PT Start Time 1304    PT Stop Time 1400    PT Time Calculation (min) 56 min           Past Medical History:  Diagnosis Date  . Arthritis   . GERD (gastroesophageal reflux disease)   . IBS (irritable bowel syndrome)    Constipation predominant  . Osteoporosis     Past Surgical History:  Procedure Laterality Date  . BUNIONECTOMY Bilateral   . COLONOSCOPY WITH PROPOFOL N/A 05/22/2018   Procedure: COLONOSCOPY WITH PROPOFOL;  Surgeon: Lucilla Lame, MD;  Location: [place];  Service: Endoscopy;  Laterality: N/A;    There were no vitals filed for this visit.   Subjective Assessment - 05/01/21 1307    Subjective Pt reports LBP is better by 70%    Pertinent History 1 vaginal deliveries with tearing, Physical fitness routine: in the summer, water skiing, used to run all the time, walk 2 miles 5 times a week. Hx fof osteoporosis, arthritis    Patient Stated Goals be able to go to the toilet and take less Miralax and have less painful intercourse              [facility] PT Assessment - 05/01/21 1311      Observation/Other Assessments   Scoliosis iliac crest/ shoulders levelled      AROM   Overall AROM Comments R figure 4 , 65 cm      Bed Mobility   Bed Mobility --   head lift with rolling                     Pelvic Floor Special Questions - 05/01/21 1351    External Perineal Exam tightness at R anterior/ posterior mm with tenderness. Decreased tenderness/ tightness post Tx, improved lengthening              [facility] Adult PT Treatment/Exercise - 05/01/21 1352      Neuro Re-ed    Neuro Re-ed Details  cued for log rolling, relaxation practice, visual cues for pelvic floor lengthening      Moist Heat Therapy   Number Minutes Moist Heat 10 Minutes    Moist Heat Location --   sarum, perineum through sheets during guided relaxation     Manual Therapy   Manual therapy comments STM/MWM at R pelvic floor mm                       PT Long Term Goals - 05/01/21 1309      PT LONG TERM GOAL #1   Title Pt will report taking Miralax once a day instead of 2x day and able to have less fecal leakage by 50% in order to improve QOL    Time 4    Period Weeks    Status On-going      PT LONG TERM GOAL #2   Title Pt will complete a food diary    Time 2    Period Weeks  Status Achieved      PT LONG TERM GOAL #3   Title Pt will demo IND with body mechanics to minimzie straining pelvic floor    Time 6    Period Weeks    Status Achieved      PT LONG TERM GOAL #4   Title Pt will demo proper deep core technique and pelvic floor coordination to promote IAP system for psotural stability and fecal continence and less pain with intercourse and gynecological exam    Time 5    Period Weeks    Status Partially Met      PT LONG TERM GOAL #5   Title Pt will demo increased hip abduction strength to > 4/5 in order to  improve tailbone pain and pelvic alignment to minimize slumped sitting    Time 8    Period Weeks    Status On-going      PT LONG TERM GOAL #6   Title Pt will demo proper alignment of coccyx/ SIJ and report no more pain with sitting not pulling pain at rectal area in order to improve QOL    Time 10    Period Weeks    Status Achieved      PT LONG TERM GOAL #7   Title Pt will report Bristol Stool Type 4 100% of the time in order to minimize straining and constipation    Baseline Bristol Stool Type 4 75% of the time, Type 5 - 25% of the time.    Time 10    Period Weeks     Status On-going      PT LONG TERM GOAL #8   Title Pt will improve hip abd/ER/flex ( figure-4 )AROM with R from lateral tibial plateau 71 cm,  L 63 cm from floor to knee to R to < 65 cm in order to sexual activities and pelvic exam and don/ dof shoes.    Time 6    Period Weeks    Status On-going                 Plan - 05/01/21 1310    Clinical Impression Statement Pt demo'd improved posture and increased hip mobility on R flex/abd/ER. Assessed pelvic floor externally today which showed increased tightness on R anterior / posterior mm compared to L. Pt demo'd decreased tenderness and tightness post Tx which was modified with less pressure to minimize tenderness. Pt demo'd improved pelvic floor lengthening post Tx. Anticipate more pelvic floor treatments will help pt achieve more R  hip flex/abd/ER and anterior pelvic tilts for optimal pelvic function.  Pt continues to benefit from skilled PT.      Stability/Clinical Decision Making Evolving/Moderate complexity    Rehab Potential Good    PT Frequency 1x / week    PT Duration Other (comment)   10   PT Treatment/Interventions ADLs/Self Care Home Management;Traction;Moist Heat;Therapeutic activities;Functional mobility training;Therapeutic exercise;Gait training;Neuromuscular re-education;Patient/family education;Manual techniques;Manual lymph drainage;Taping;Energy conservation;Scar mobilization;Balance training;Spinal Manipulations;Dry needling;Cryotherapy    Consulted and Agree with Plan of Care Patient           Patient will benefit from skilled therapeutic intervention in order to improve the following deficits and impairments:  Abnormal gait,Decreased coordination,Decreased endurance,Difficulty walking,Decreased range of motion,Decreased strength,Decreased scar mobility,Decreased mobility,Increased muscle spasms,Improper body mechanics,Increased fascial restricitons,Postural dysfunction,Hypomobility,Decreased safety awareness,Decreased  balance,Decreased activity tolerance,Impaired flexibility,Pain  Visit Diagnosis: Other lack of coordination  Fecal smearing  Other abnormalities of gait and mobility     Problem List Patient Active Problem  List   Diagnosis Date Noted  . Encounter for screening colonoscopy   . Hyperlipidemia 01/28/2018  . Body mass index (BMI) 19.9 or less, adult 01/28/2018  . Dyspareunia in female 01/28/2018  . Osteoarthritis of both hands 01/28/2018  . Localized osteoporosis without current pathological fracture 09/14/2017  . Gastroesophageal reflux disease without esophagitis 09/14/2017    Jerl Mina ,PT, DPT, E-RYT  05/01/2021, 3:03 PM  [place] MAIN [facility] SERVICES [address] [place], [place], [address] Phone: [phone]   Fax:  [phone]  Name: Mary Kirk MRN: [id number] Date of Birth: 1956-07-09

## 2021-05-08 ENCOUNTER — Other Ambulatory Visit: Payer: Self-pay

## 2021-05-08 ENCOUNTER — Ambulatory Visit: Payer: BC Managed Care – PPO | Admitting: Physical Therapy

## 2021-05-08 DIAGNOSIS — R2689 Other abnormalities of gait and mobility: Secondary | ICD-10-CM

## 2021-05-08 DIAGNOSIS — R151 Fecal smearing: Secondary | ICD-10-CM

## 2021-05-08 DIAGNOSIS — R278 Other lack of coordination: Secondary | ICD-10-CM | POA: Diagnosis not present

## 2021-05-08 NOTE — Patient Instructions (Signed)
Stretch for pelvic floor   V- slides  "v heels slide away and then back toward buttocks and then rock knee to slight ,  slide heel along at 11 o clock away from buttocks   10 reps     On belly: Riding horse edge of mattress  knee bent like riding a horse, move knee towards armpit and out  10 reps   Mermaid stretch  Rocking while seated on the floor with heels to one side of the hip Heels to one side of the hip  Rock forward towards the knee that is bent , rock beck towards the opposite sitting bones   

## 2021-05-08 NOTE — Therapy (Signed)
[place] MAIN [facility] SERVICES [address] [place], [place], [address] Phone: [phone]   Fax:  [phone]  Physical Therapy Treatment  Patient Details  Name: Mary Kirk MRN: [id number] Date of Birth: 1956/05/29 Referring Provider (PT): Reginia Forts MD   Encounter Date: 05/08/2021   PT End of Session - 05/08/21 1349    Visit Number 6    Number of Visits 10    Date for PT Re-Evaluation 06/12/21    PT Start Time 1302    PT Stop Time 1400    PT Time Calculation (min) 58 min    Activity Tolerance Patient tolerated treatment well    Behavior During Therapy [facility] for tasks assessed/performed           Past Medical History:  Diagnosis Date  . Arthritis   . GERD (gastroesophageal reflux disease)   . IBS (irritable bowel syndrome)    Constipation predominant  . Osteoporosis     Past Surgical History:  Procedure Laterality Date  . BUNIONECTOMY Bilateral   . COLONOSCOPY WITH PROPOFOL N/A 05/22/2018   Procedure: COLONOSCOPY WITH PROPOFOL;  Surgeon: Lucilla Lame, MD;  Location: [place];  Service: Endoscopy;  Laterality: N/A;    There were no vitals filed for this visit.   Subjective Assessment - 05/08/21 1311    Subjective Pt reports she noticed her body tightening up when rushing to catch her plane and then noticed bowel and GI issues were off.    Pertinent History 1 vaginal deliveries with tearing, Physical fitness routine: in the summer, water skiing, used to run all the time, walk 2 miles 5 times a week. Hx fof osteoporosis, arthritis    Patient Stated Goals be able to go to the toilet and take less Miralax and have less painful intercourse              [facility] PT Assessment - 05/08/21 1347      Posture/Postural Control   Posture Comments upright posture                      Pelvic Floor Special Questions - 05/08/21 1312    External Perineal Exam tightness at ischiorami, bulbospongiosus,  iaschiocavernosus B    Pelvic Floor Internal Exam pt consented verbally for rectal assessment and had no contraindications    Exam Type Rectal    Palpation palpation at entrance of rectal sphincter only due to tightness and tenderness at 12 o'clock, 4 clock ( L sideyling).             [facility] Adult PT Treatment/Exercise - 05/08/21 1347      Neuro Re-ed    Neuro Re-ed Details  cued for pelvic floor stretches, pelvic tilts      Moist Heat Therapy   Number Minutes Moist Heat 5 Minutes    Moist Heat Location --   perineum in prone to promote anterior tilt of pelvs, ( not billed)     Manual Therapy   Internal Pelvic Floor external and internal technique to help minimize pain at coccygeus                       PT Long Term Goals - 05/01/21 1309      PT LONG TERM GOAL #1   Title Pt will report taking Miralax once a day instead of 2x day and able to have less fecal leakage by 50% in order to improve QOL  Time 4    Period Weeks    Status On-going      PT LONG TERM GOAL #2   Title Pt will complete a food diary    Time 2    Period Weeks    Status Achieved      PT LONG TERM GOAL #3   Title Pt will demo IND with body mechanics to minimzie straining pelvic floor    Time 6    Period Weeks    Status Achieved      PT LONG TERM GOAL #4   Title Pt will demo proper deep core technique and pelvic floor coordination to promote IAP system for psotural stability and fecal continence and less pain with intercourse and gynecological exam    Time 5    Period Weeks    Status Partially Met      PT LONG TERM GOAL #5   Title Pt will demo increased hip abduction strength to > 4/5 in order to  improve tailbone pain and pelvic alignment to minimize slumped sitting    Time 8    Period Weeks    Status On-going      PT LONG TERM GOAL #6   Title Pt will demo proper alignment of coccyx/ SIJ and report no more pain with sitting not pulling pain at rectal area in order to improve QOL     Time 10    Period Weeks    Status Achieved      PT LONG TERM GOAL #7   Title Pt will report Bristol Stool Type 4 100% of the time in order to minimize straining and constipation    Baseline Bristol Stool Type 4 75% of the time, Type 5 - 25% of the time.    Time 10    Period Weeks    Status On-going      PT LONG TERM GOAL #8   Title Pt will improve hip abd/ER/flex ( figure-4 )AROM with R from lateral tibial plateau 71 cm,  L 63 cm from floor to knee to R to < 65 cm in order to sexual activities and pelvic exam and don/ dof shoes.    Time 6    Period Weeks    Status On-going                 Plan - 05/08/21 1350    Clinical Impression Statement Pt only tolerated shallow pressure at entrance of rectal sphincter during rectal assessment. Pt expressed pain at anterior mm more than posterior but pressure and technique was modified with both internal and external palpation and movement with mobilization. Pt expressed increased tolerance with these Tx. Pt demo'd decreased mm tensions and increased lengthening post Tx. Pt continues to benefit from skilled PT    Stability/Clinical Decision Making Evolving/Moderate complexity    Rehab Potential Good    PT Frequency 1x / week    PT Duration Other (comment)   10   PT Treatment/Interventions ADLs/Self Care Home Management;Traction;Moist Heat;Therapeutic activities;Functional mobility training;Therapeutic exercise;Gait training;Neuromuscular re-education;Patient/family education;Manual techniques;Manual lymph drainage;Taping;Energy conservation;Scar mobilization;Balance training;Spinal Manipulations;Dry needling;Cryotherapy    Consulted and Agree with Plan of Care Patient           Patient will benefit from skilled therapeutic intervention in order to improve the following deficits and impairments:  Abnormal gait,Decreased coordination,Decreased endurance,Difficulty walking,Decreased range of motion,Decreased strength,Decreased scar  mobility,Decreased mobility,Increased muscle spasms,Improper body mechanics,Increased fascial restricitons,Postural dysfunction,Hypomobility,Decreased safety awareness,Decreased balance,Decreased activity tolerance,Impaired flexibility,Pain  Visit Diagnosis: Other lack of  coordination  Fecal smearing  Other abnormalities of gait and mobility     Problem List Patient Active Problem List   Diagnosis Date Noted  . Encounter for screening colonoscopy   . Hyperlipidemia 01/28/2018  . Body mass index (BMI) 19.9 or less, adult 01/28/2018  . Dyspareunia in female 01/28/2018  . Osteoarthritis of both hands 01/28/2018  . Localized osteoporosis without current pathological fracture 09/14/2017  . Gastroesophageal reflux disease without esophagitis 09/14/2017    Jerl Mina ,PT, DPT, E-RYT  05/08/2021, 2:15 PM  [place] MAIN [facility] SERVICES [address] [place], [place], [address] Phone: [phone]   Fax:  [phone]  Name: Mary Kirk MRN: [id number] Date of Birth: [birth year]

## 2021-05-15 ENCOUNTER — Other Ambulatory Visit: Payer: Self-pay

## 2021-05-15 ENCOUNTER — Ambulatory Visit: Payer: BC Managed Care – PPO | Admitting: Physical Therapy

## 2021-05-15 DIAGNOSIS — R278 Other lack of coordination: Secondary | ICD-10-CM | POA: Diagnosis not present

## 2021-05-15 DIAGNOSIS — R2689 Other abnormalities of gait and mobility: Secondary | ICD-10-CM

## 2021-05-15 DIAGNOSIS — R151 Fecal smearing: Secondary | ICD-10-CM

## 2021-05-15 NOTE — Patient Instructions (Addendum)
Lying on back, knees bent    band under ballmounds  while laying on back w/ knees bent  "W" exercise  10 reps x 2 sets   Band is placed under feet, knees bent, feet are hip width apart Hold band with thumbs point out, keep upper arm and elbow touching the bed the whole time  - inhale and then exhale pull bands by bending elbows hands move in a "w"  (feel shoulder blades squeezing)    _____________________________   Lat pull down   band on over the door    Stand with back facing away the door  mini squat   elbow by ribs "w"  Inhale down, exhale up  Pressing through 4 points of feet entire process  Knees behind toes on the way down   30 reps    ________________________________    Facing door,  Backward lunge ( front knee above ankle)  Pull band down by pocket, shoulders squeezing back  30 reps ( 15 on L/R)

## 2021-05-15 NOTE — Therapy (Signed)
[place] MAIN [facility] SERVICES [address] [place], [place], [address] Phone: [phone]   Fax:  [phone]  Physical Therapy Treatment  Patient Details  Name: Mary Kirk MRN: [id number] Date of Birth: [birth year] Referring Provider (PT): Reginia Forts MD   Encounter Date: 05/15/2021   PT End of Session - 05/15/21 1307    Visit Number 7    Number of Visits 10    Date for PT Re-Evaluation 06/12/21    PT Start Time 3845    PT Stop Time 1402    PT Time Calculation (min) 59 min    Activity Tolerance Patient tolerated treatment well    Behavior During Therapy [facility] for tasks assessed/performed           Past Medical History:  Diagnosis Date  . Arthritis   . GERD (gastroesophageal reflux disease)   . IBS (irritable bowel syndrome)    Constipation predominant  . Osteoporosis     Past Surgical History:  Procedure Laterality Date  . BUNIONECTOMY Bilateral   . COLONOSCOPY WITH PROPOFOL N/A 05/22/2018   Procedure: COLONOSCOPY WITH PROPOFOL;  Surgeon: Lucilla Lame, MD;  Location: [place];  Service: Endoscopy;  Laterality: N/A;    There were no vitals filed for this visit.   Subjective Assessment - 05/15/21 1307    Subjective Pt reports bowel movements are larger in size. Fecal leakage is less.    Pertinent History 1 vaginal deliveries with tearing, Physical fitness routine: in the summer, water skiing, used to run all the time, walk 2 miles 5 times a week. Hx fof osteoporosis, arthritis    Patient Stated Goals be able to go to the toilet and take less Miralax and have less painful intercourse              [facility] PT Assessment - 05/15/21 1331      Coordination   Coordination and Movement Description poor alignment of knee in backward lunge      Palpation   Spinal mobility T3-4 deviated R /hypomobile T10-12 , intercostal mm tightness posterior rib 11-12                         [facility] Adult PT  Treatment/Exercise - 05/15/21 1333      Neuro Re-ed    Neuro Re-ed Details  cued for resistance band strengthening of scapulothoracic strengthening, propioception and scapulothoracic strengthening      Moist Heat Therapy   Number Minutes Moist Heat 5 Minutes    Moist Heat Location --   5 min (not billed)     Manual Therapy   Manual therapy comments STM/MWM at problem areas noted assessment                       PT Long Term Goals - 05/15/21 1357      PT LONG TERM GOAL #1   Title Pt will report taking Miralax once a day instead of 2x day and able to have less fecal leakage by 50% in order to improve QOL    Time 4    Period Weeks    Status On-going      PT LONG TERM GOAL #2   Title Pt will complete a food diary    Time 2    Period Weeks    Status Achieved      PT LONG TERM GOAL #3   Title Pt will demo IND with  body mechanics to minimzie straining pelvic floor    Time 6    Period Weeks    Status Achieved      PT LONG TERM GOAL #4   Title Pt will demo proper deep core technique and pelvic floor coordination to promote IAP system for psotural stability and fecal continence and less pain with intercourse and gynecological exam    Time 5    Period Weeks    Status Partially Met      PT LONG TERM GOAL #5   Title Pt will demo increased hip abduction strength to > 4/5 in order to  improve tailbone pain and pelvic alignment to minimize slumped sitting    Time 8    Period Weeks    Status On-going      Additional Long Term Goals   Additional Long Term Goals Yes      PT LONG TERM GOAL #6   Title Pt will demo proper alignment of coccyx/ SIJ and report no more pain with sitting not pulling pain at rectal area in order to improve QOL    Time 10    Period Weeks    Status Achieved      PT LONG TERM GOAL #7   Title Pt will report Bristol Stool Type 4 100% of the time in order to minimize straining and constipation    Baseline Bristol Stool Type 4 75% of the time, Type  5 - 25% of the time.    Time 10    Period Weeks    Status On-going      PT LONG TERM GOAL #8   Title Pt will improve hip abd/ER/flex ( figure-4 )AROM with R from lateral tibial plateau 71 cm,  L 63 cm from floor to knee to R to < 65 cm in order to sexual activities and pelvic exam and don/ dof shoes.    Time 6    Period Weeks    Status On-going      PT LONG TERM GOAL  #9   TITLE Pt will demo improved gait mechanics with running to minimzie risk for injuries    Time 8    Period Weeks    Status New    Target Date 07/10/21                 Plan - 05/15/21 1307    Clinical Impression Statement Pt responded positively to rectal manual Tx this past session as pt reported having larger formed bowel movements and less fecal leakage. Pt continue to require manual to address RUE ( shoulder IR) 2/2 scoliosis. Progressed pt today to scapulothoracic and lower kinetic chain propioception training. Functional positions were selected to promote more anterior tilt of pelvic floor. Pt required moderate cues for proper form. Plan to reassess and continue to treat pelvic floor at next session. Pt continues to benefit from skilled PT    Stability/Clinical Decision Making Evolving/Moderate complexity    Rehab Potential Good    PT Frequency 1x / week    PT Duration Other (comment)   10   PT Treatment/Interventions ADLs/Self Care Home Management;Traction;Moist Heat;Therapeutic activities;Functional mobility training;Therapeutic exercise;Gait training;Neuromuscular re-education;Patient/family education;Manual techniques;Manual lymph drainage;Taping;Energy conservation;Scar mobilization;Balance training;Spinal Manipulations;Dry needling;Cryotherapy    Consulted and Agree with Plan of Care Patient           Patient will benefit from skilled therapeutic intervention in order to improve the following deficits and impairments:  Abnormal gait,Decreased coordination,Decreased endurance,Difficulty  walking,Decreased range of motion,Decreased strength,Decreased  scar mobility,Decreased mobility,Increased muscle spasms,Improper body mechanics,Increased fascial restricitons,Postural dysfunction,Hypomobility,Decreased safety awareness,Decreased balance,Decreased activity tolerance,Impaired flexibility,Pain  Visit Diagnosis: Other lack of coordination  Fecal smearing  Other abnormalities of gait and mobility     Problem List Patient Active Problem List   Diagnosis Date Noted  . Encounter for screening colonoscopy   . Hyperlipidemia 01/28/2018  . Body mass index (BMI) 19.9 or less, adult 01/28/2018  . Dyspareunia in female 01/28/2018  . Osteoarthritis of both hands 01/28/2018  . Localized osteoporosis without current pathological fracture 09/14/2017  . Gastroesophageal reflux disease without esophagitis 09/14/2017    Jerl Mina ,PT, DPT, E-RYT  05/15/2021, 2:00 PM  [place] MAIN [facility] SERVICES [address] [place], [place], [address] Phone: [phone]   Fax:  [phone]  Name: Mary Kirk MRN: [id number] Date of Birth: 1956-04-02

## 2021-05-22 ENCOUNTER — Ambulatory Visit: Payer: BC Managed Care – PPO | Admitting: Physical Therapy

## 2021-05-23 ENCOUNTER — Ambulatory Visit: Payer: BC Managed Care – PPO | Attending: Family Medicine | Admitting: Physical Therapy

## 2021-05-23 ENCOUNTER — Other Ambulatory Visit: Payer: Self-pay

## 2021-05-23 DIAGNOSIS — R151 Fecal smearing: Secondary | ICD-10-CM

## 2021-05-23 DIAGNOSIS — R2689 Other abnormalities of gait and mobility: Secondary | ICD-10-CM | POA: Diagnosis present

## 2021-05-23 DIAGNOSIS — R278 Other lack of coordination: Secondary | ICD-10-CM | POA: Diagnosis present

## 2021-05-23 NOTE — Patient Instructions (Addendum)
Running stretches on bench:  Quad  Hamstring   Figure -4   Calves    Adductors   Twist   __   When doing deep core, find anterior pelvic tilt    ___ Minisquat: Scoot buttocks back slight, hinge like you are looking at your reflection on a pond  Knees behind toes,  Inhale to "smell flowers"  Exhale on the rise "like rocket"  Do not lock knees, have more weight across ballmounds of feet, toes relaxed   THEN HALF step on both feet first lap with left foot leading down a hall way = 1 lap  Repeated with other foot leading =1 lap  2 laps each side

## 2021-05-23 NOTE — Therapy (Signed)
[place] MAIN [facility] SERVICES [address] [place], [place], [address] Phone: [phone]   Fax:  [phone]  Physical Therapy Treatment  Patient Details  Name: Mary Kirk MRN: [id number] Date of Birth: Apr 16, 1956 Referring Provider (PT): Reginia Forts MD   Encounter Date: 05/23/2021   PT End of Session - 05/23/21 1404    Visit Number 8    Number of Visits 10    Date for PT Re-Evaluation 06/12/21    PT Start Time 3295    PT Stop Time 1405    PT Time Calculation (min) 59 min    Activity Tolerance Patient tolerated treatment well    Behavior During Therapy [facility] for tasks assessed/performed           Past Medical History:  Diagnosis Date  . Arthritis   . GERD (gastroesophageal reflux disease)   . IBS (irritable bowel syndrome)    Constipation predominant  . Osteoporosis     Past Surgical History:  Procedure Laterality Date  . BUNIONECTOMY Bilateral   . COLONOSCOPY WITH PROPOFOL N/A 05/22/2018   Procedure: COLONOSCOPY WITH PROPOFOL;  Surgeon: Lucilla Lame, MD;  Location: [place];  Service: Endoscopy;  Laterality: N/A;    There were no vitals filed for this visit.   Subjective Assessment - 05/23/21 1354    Subjective Fecal leakage is less. Pt had no leakage across 3 days. Bowel movements are still larger in size    Pertinent History 1 vaginal deliveries with tearing, Physical fitness routine: in the summer, water skiing, used to run all the time, walk 2 miles 5 times a week. Hx fof osteoporosis, arthritis    Patient Stated Goals be able to go to the toilet and take less Miralax and have less painful intercourse              [facility] PT Assessment - 05/23/21 1359      Ambulation/Gait   Gait Comments running analysis : midfoot strike,                      Pelvic Floor Special Questions - 05/23/21 1359    External Perineal Exam tightness at ischiorami, bulbospongiosus, iaschiocavernosus B    Pelvic  Floor Internal Exam pt consented verbally for rectal assessment and had no contraindications    Exam Type Vaginal    Palpation tightness/ tender  at R 9- 11 o'clock             [facility] Adult PT Treatment/Exercise - 05/23/21 1400      Manual Therapy   Internal Pelvic Floor STM/MWM at areas noted in assessment  , lighter pressure when pt c/o tenderness                       PT Long Term Goals - 05/15/21 1357      PT LONG TERM GOAL #1   Title Pt will report taking Miralax once a day instead of 2x day and able to have less fecal leakage by 50% in order to improve QOL    Time 4    Period Weeks    Status On-going      PT LONG TERM GOAL #2   Title Pt will complete a food diary    Time 2    Period Weeks    Status Achieved      PT LONG TERM GOAL #3   Title Pt will demo IND with body mechanics to  minimzie straining pelvic floor    Time 6    Period Weeks    Status Achieved      PT LONG TERM GOAL #4   Title Pt will demo proper deep core technique and pelvic floor coordination to promote IAP system for psotural stability and fecal continence and less pain with intercourse and gynecological exam    Time 5    Period Weeks    Status Partially Met      PT LONG TERM GOAL #5   Title Pt will demo increased hip abduction strength to > 4/5 in order to  improve tailbone pain and pelvic alignment to minimize slumped sitting    Time 8    Period Weeks    Status On-going      Additional Long Term Goals   Additional Long Term Goals Yes      PT LONG TERM GOAL #6   Title Pt will demo proper alignment of coccyx/ SIJ and report no more pain with sitting not pulling pain at rectal area in order to improve QOL    Time 10    Period Weeks    Status Achieved      PT LONG TERM GOAL #7   Title Pt will report Bristol Stool Type 4 100% of the time in order to minimize straining and constipation    Baseline Bristol Stool Type 4 75% of the time, Type 5 - 25% of the time.    Time 10     Period Weeks    Status On-going      PT LONG TERM GOAL #8   Title Pt will improve hip abd/ER/flex ( figure-4 )AROM with R from lateral tibial plateau 71 cm,  L 63 cm from floor to knee to R to < 65 cm in order to sexual activities and pelvic exam and don/ dof shoes.    Time 6    Period Weeks    Status On-going      PT LONG TERM GOAL  #9   TITLE Pt will demo improved gait mechanics with running to minimzie risk for injuries    Time 8    Period Weeks    Status New    Target Date 07/10/21                 Plan - 05/23/21 1404    Clinical Impression Statement Pt demo'd increased pelvic floor tightness with internal assessment. Pt demo'd increased lengthening with cue for anterior tilt of pelvic floor. Added functional exercises to promote more anterior tilt of pelvis and strengthen hip abduction. Running analysis showed good form and advised pt to return to running 3 min with stretches to minimize overactivity of pelvic floor. Plan to educate pt on thorough BLE stretches with strap at next session. Pt continues to benefit from skilled PT    Stability/Clinical Decision Making Evolving/Moderate complexity    Rehab Potential Good    PT Frequency 1x / week    PT Duration Other (comment)   10   PT Treatment/Interventions ADLs/Self Care Home Management;Traction;Moist Heat;Therapeutic activities;Functional mobility training;Therapeutic exercise;Gait training;Neuromuscular re-education;Patient/family education;Manual techniques;Manual lymph drainage;Taping;Energy conservation;Scar mobilization;Balance training;Spinal Manipulations;Dry needling;Cryotherapy    Consulted and Agree with Plan of Care Patient           Patient will benefit from skilled therapeutic intervention in order to improve the following deficits and impairments:  Abnormal gait,Decreased coordination,Decreased endurance,Difficulty walking,Decreased range of motion,Decreased strength,Decreased scar mobility,Decreased  mobility,Increased muscle spasms,Improper body mechanics,Increased fascial restricitons,Postural dysfunction,Hypomobility,Decreased  safety awareness,Decreased balance,Decreased activity tolerance,Impaired flexibility,Pain  Visit Diagnosis: Other lack of coordination  Fecal smearing  Other abnormalities of gait and mobility     Problem List Patient Active Problem List   Diagnosis Date Noted  . Encounter for screening colonoscopy   . Hyperlipidemia 01/28/2018  . Body mass index (BMI) 19.9 or less, adult 01/28/2018  . Dyspareunia in female 01/28/2018  . Osteoarthritis of both hands 01/28/2018  . Localized osteoporosis without current pathological fracture 09/14/2017  . Gastroesophageal reflux disease without esophagitis 09/14/2017    Jerl Mina ,PT, DPT, E-RYT  05/23/2021, 2:06 PM  [place] MAIN [facility] SERVICES [address] [place], [place], [address] Phone: [phone]   Fax:  [phone]  Name: Chelisa Hennen MRN: [id number] Date of Birth: 1955/12/28

## 2021-05-29 ENCOUNTER — Ambulatory Visit: Payer: BC Managed Care – PPO | Admitting: Physical Therapy

## 2021-06-05 ENCOUNTER — Other Ambulatory Visit: Payer: Self-pay

## 2021-06-05 ENCOUNTER — Ambulatory Visit: Payer: BC Managed Care – PPO | Admitting: Physical Therapy

## 2021-06-05 DIAGNOSIS — R151 Fecal smearing: Secondary | ICD-10-CM

## 2021-06-05 DIAGNOSIS — R2689 Other abnormalities of gait and mobility: Secondary | ICD-10-CM

## 2021-06-05 DIAGNOSIS — R278 Other lack of coordination: Secondary | ICD-10-CM | POA: Diagnosis not present

## 2021-06-05 NOTE — Therapy (Addendum)
[place] MAIN [facility] SERVICES [place], [place], [address] Phone: [phone]   Fax:  [phone]  Physical Therapy Treatment  Patient Details  Name: Mary Kirk MRN: [id number] Date of Birth: June 20, 1956 Referring Provider (PT): Reginia Forts MD   Encounter Date: 06/05/2021   PT End of Session - 06/05/21 1104     Visit Number 9    Number of Visits 10    Date for PT Re-Evaluation 06/12/21    PT Start Time 1100    PT Stop Time 1200    PT Time Calculation (min) 60 min    Activity Tolerance Patient tolerated treatment well    Behavior During Therapy [facility] for tasks assessed/performed             Past Medical History:  Diagnosis Date   Arthritis    GERD (gastroesophageal reflux disease)    IBS (irritable bowel syndrome)    Constipation predominant   Osteoporosis     Past Surgical History:  Procedure Laterality Date   BUNIONECTOMY Bilateral    COLONOSCOPY WITH PROPOFOL N/A 05/22/2018   Procedure: COLONOSCOPY WITH PROPOFOL;  Surgeon: Lucilla Lame, MD;  Location: [place];  Service: Endoscopy;  Laterality: N/A;    There were no vitals filed for this visit.   Subjective Assessment - 06/05/21 1104     Subjective Pt had 11 days out of 14 days when she experienced  leakage of  diarrhea. For the most part, it was less than it was. Pt backed off on Miralax 25% less of what she was taking and started this change 2 weeks ago. Pt has also started running with 6 x across the past 2 weeks. Pt did not walk as often the past 2 weeks.    Pertinent History 1 vaginal deliveries with tearing, Physical fitness routine: in the summer, water skiing, used to run all the time, walk 2 miles 5 times a week. Hx fof osteoporosis, arthritis    Patient Stated Goals be able to go to the toilet and take less Miralax and have less painful intercourse                [facility] PT Assessment - 06/05/21 1207       Coordination    Coordination and Movement Description IND with anterior pelvic tilt                        Pelvic Floor Special Questions - 06/05/21 1206     External Perineal Exam tightness at ischiorami, bulbospongiosus, iaschiocavernosus B    Pelvic Floor Internal Exam pt consented verbally for rectal assessment and had no contraindications    Exam Type Vaginal    Palpation tightness/ tender at obt int / ATLA B, tightness at coccygeus/ puborectalis anterior B               [facility] Adult PT Treatment/Exercise - 06/05/21 1205       Neuro Re-ed    Neuro Re-ed Details  cued for less abdominal mm with exhalation, cued for sstretches for lower kinetic chain      Modalities   Modalities Moist Heat      Moist Heat Therapy   Number Minutes Moist Heat 5 Minutes    Moist Heat Location --   perineum , through sheets     Manual Therapy   Internal Pelvic Floor STM/MWM at areas noted in assessment  , lighter pressure when pt c/o tenderness  PT Long Term Goals - 05/15/21 1357       PT LONG TERM GOAL #1   Title Pt will report taking Miralax once a day instead of 2x day and able to have less fecal leakage by 50% in order to improve QOL    Time 4    Period Weeks    Status On-going      PT LONG TERM GOAL #2   Title Pt will complete a food diary    Time 2    Period Weeks    Status Achieved      PT LONG TERM GOAL #3   Title Pt will demo IND with body mechanics to minimzie straining pelvic floor    Time 6    Period Weeks    Status Achieved      PT LONG TERM GOAL #4   Title Pt will demo proper deep core technique and pelvic floor coordination to promote IAP system for psotural stability and fecal continence and less pain with intercourse and gynecological exam    Time 5    Period Weeks    Status Partially Met      PT LONG TERM GOAL #5   Title Pt will demo increased hip abduction strength to > 4/5 in order to  improve tailbone pain and pelvic  alignment to minimize slumped sitting    Time 8    Period Weeks    Status On-going      Additional Long Term Goals   Additional Long Term Goals Yes      PT LONG TERM GOAL #6   Title Pt will demo proper alignment of coccyx/ SIJ and report no more pain with sitting not pulling pain at rectal area in order to improve QOL    Time 10    Period Weeks    Status Achieved      PT LONG TERM GOAL #7   Title Pt will report Bristol Stool Type 4 100% of the time in order to minimize straining and constipation    Baseline Bristol Stool Type 4 75% of the time, Type 5 - 25% of the time.    Time 10    Period Weeks    Status On-going      PT LONG TERM GOAL #8   Title Pt will improve hip abd/ER/flex ( figure-4 )AROM with R from lateral tibial plateau 71 cm,  L 63 cm from floor to knee to R to < 65 cm in order to sexual activities and pelvic exam and don/ dof shoes.    Time 6    Period Weeks    Status On-going      PT LONG TERM GOAL  #9   TITLE Pt will demo improved gait mechanics with running to minimzie risk for injuries    Time 8    Period Weeks    Status New    Target Date 07/10/21                   Plan - 06/05/21 1114     Clinical Impression Statement Pt has a relapse with leakage as she backed of on her Miralax by 25%. Advised pt to work with MD on changing Miralax intake.    Internal pelvic floor assessment showed decreased tightness at superficial layers. Deeper layers were decreased in tightness and tenderness with modifications to technique to accommodate pt's tolerance. Added lower kinetic chain chain stretches as pt is returning to running 3 min intervals.  Upright posture and less rounded shoulders are still being maintained.   Plan to continue with minimize pelvic floor mm tightness at next session.   Pt continues to benefit from skilled PT   Stability/Clinical Decision Making Evolving/Moderate complexity    Rehab Potential Good    PT Frequency 1x / week    PT  Duration Other (comment)   10   PT Treatment/Interventions ADLs/Self Care Home Management;Traction;Moist Heat;Therapeutic activities;Functional mobility training;Therapeutic exercise;Gait training;Neuromuscular re-education;Patient/family education;Manual techniques;Manual lymph drainage;Taping;Energy conservation;Scar mobilization;Balance training;Spinal Manipulations;Dry needling;Cryotherapy    Consulted and Agree with Plan of Care Patient             Patient will benefit from skilled therapeutic intervention in order to improve the following deficits and impairments:  Abnormal gait, Decreased coordination, Decreased endurance, Difficulty walking, Decreased range of motion, Decreased strength, Decreased scar mobility, Decreased mobility, Increased muscle spasms, Improper body mechanics, Increased fascial restricitons, Postural dysfunction, Hypomobility, Decreased safety awareness, Decreased balance, Decreased activity tolerance, Impaired flexibility, Pain  Visit Diagnosis: Other lack of coordination  Fecal smearing  Other abnormalities of gait and mobility     Problem List Patient Active Problem List   Diagnosis Date Noted   Encounter for screening colonoscopy    Hyperlipidemia 01/28/2018   Body mass index (BMI) 19.9 or less, adult 01/28/2018   Dyspareunia in female 01/28/2018   Osteoarthritis of both hands 01/28/2018   Localized osteoporosis without current pathological fracture 09/14/2017   Gastroesophageal reflux disease without esophagitis 09/14/2017    Jerl Mina ,PT, DPT, E-RYT  06/05/2021, 12:09 PM  [place] MAIN [facility] SERVICES [address] [place], [place], [address] Phone: [phone]   Fax:  [phone]  Name: Mary Kirk MRN: [id number] Date of Birth: 10-06-1956

## 2021-06-05 NOTE — Patient Instructions (Addendum)
Stretches : (Cuing provided for proper alignment)  Instructions start with Strap on R    Stretches for your legs: LAYING on Back Use upper arms and elbows for stability when pulling strap Opposite knee bent and foot firm in align with hip   Strap on ballmound:  Hip socket  _strap, L knee bent, R ballmound against strap and spread toes, rolling foot 15 deg out and in across midline.  10 reps each side   Hamstring _knee bends  10 reps  With knee pointing straight ( slightly to outside to minimize snapping sensation)      10 reps with knee pointing out towards armpit ( notice the stretch in the medial hamstring muscle)     IT band  _scoot hips to R, cross R leg over L and straighten knee with strap on ballmound,   Bend knee back and forth 5x    Quad in sidelying _strap around the ankle, pulling ankle towards buttocks  Bottom leg firm and stabilization with knee bent  Adductors and pelvic floor ( Happy Baby) : _knees are wide towards armpits, sole of feet towards ceiling   On your back again:  Strap under R thigh Hip abductors ( figure 4)      ,L ankle over R thigh ( stretching L glut)     5  Breaths   

## 2021-06-06 ENCOUNTER — Encounter: Payer: BC Managed Care – PPO | Admitting: Physical Therapy

## 2021-06-12 ENCOUNTER — Ambulatory Visit: Payer: BC Managed Care – PPO | Admitting: Physical Therapy

## 2021-06-12 ENCOUNTER — Other Ambulatory Visit: Payer: Self-pay

## 2021-06-12 DIAGNOSIS — R278 Other lack of coordination: Secondary | ICD-10-CM | POA: Diagnosis not present

## 2021-06-12 DIAGNOSIS — R2689 Other abnormalities of gait and mobility: Secondary | ICD-10-CM

## 2021-06-12 DIAGNOSIS — R151 Fecal smearing: Secondary | ICD-10-CM

## 2021-06-13 ENCOUNTER — Encounter: Payer: Self-pay | Admitting: Physical Therapy

## 2021-06-13 ENCOUNTER — Encounter: Payer: BC Managed Care – PPO | Admitting: Physical Therapy

## 2021-06-13 NOTE — Therapy (Signed)
[place] MAIN [facility] SERVICES [place], [place], [address] Phone: [phone]   Fax:  [phone]  Physical Therapy Treatment, Progress Note reporting from 04/03/21 to 06/12/21  Patient Details  Name: Mary Kirk MRN: [id number] Date of Birth: [birth year] Referring Provider (PT): Reginia Forts MD   Encounter Date: 06/12/2021   PT End of Session - 06/12/21 1610     Visit Number 10    Date for PT Re-Evaluation 08/21/21    PT Start Time 1603    PT Stop Time 1700    PT Time Calculation (min) 57 min    Activity Tolerance Patient tolerated treatment well    Behavior During Therapy [facility] for tasks assessed/performed             Past Medical History:  Diagnosis Date   Arthritis    GERD (gastroesophageal reflux disease)    IBS (irritable bowel syndrome)    Constipation predominant   Osteoporosis     Past Surgical History:  Procedure Laterality Date   BUNIONECTOMY Bilateral    COLONOSCOPY WITH PROPOFOL N/A 05/22/2018   Procedure: COLONOSCOPY WITH PROPOFOL;  Surgeon: Lucilla Lame, MD;  Location: [place];  Service: Endoscopy;  Laterality: N/A;    There were no vitals filed for this visit.    Subjective Assessment - 06/12/21 1607     Subjective Pt noticed she is eating different and also noticed no leakage when she did not run. Pt is wondering if the leakage is due to running    Pertinent History 1 vaginal deliveries with tearing, Physical fitness routine: in the summer, water skiing, used to run all the time, walk 2 miles 5 times a week. Hx fof osteoporosis, arthritis    Patient Stated Goals be able to go to the toilet and take less Miralax and have less painful intercourse                            Objective measurements completed on examination: See above findings.     Pelvic Floor Special Questions - 06/13/21 1041     Pelvic Floor Internal Exam pt consented verbally for rectal  assessment and had no contraindications    Exam Type Rectal    Palpation tightness/ tender at L 5 -6 oclock , tightness at puborectalis              [facility] Adult PT Treatment/Exercise - 06/13/21 1041       Therapeutic Activites    Other Therapeutic Activities reassessed goals, troubleshoot factors impacting relapse with leakage. Suggested to return to same diet she had when she was making improvements and to hold off on running for 1 week      Neuro Re-ed    Neuro Re-ed Details  cued for leg stretches technique      Modalities   Modalities Moist Heat      Moist Heat Therapy   Moist Heat Location --   perineum , through sheets, in butterfly pose     Manual Therapy   Internal Pelvic Floor STM/MWM at areas noted in assessment  , lighter pressure when pt c/o tenderness                         PT Long Term Goals - 06/12/21 1611       PT LONG TERM GOAL #1   Title Pt will report complete emptying of bowel  movements 100% of the time  in order to improve QOL    Time 4    Period Weeks    Status Revised      PT LONG TERM GOAL #2   Title Pt will complete a food diary    Time 2    Period Weeks    Status Achieved      PT LONG TERM GOAL #3   Title Pt will demo IND with body mechanics to minimzie straining pelvic floor    Time 6    Period Weeks    Status Achieved      PT LONG TERM GOAL #4   Title Pt will demo proper deep core technique and pelvic floor coordination to promote IAP system for postural stability and fecal continence and less pain with intercourse and gynecological exam    Time 5    Period Weeks    Status Partially Met      PT LONG TERM GOAL #5   Title Pt will demo increased hip abduction strength to > 4/5 in order to  improve tailbone pain and pelvic alignment to minimize slumped sitting ( 6/28/: 5/5)    Time 8    Period Weeks    Status Achieved      Additional Long Term Goals   Additional Long Term Goals Yes      PT LONG TERM GOAL #6    Title Pt will demo proper alignment of coccyx/ SIJ and report no more pain with sitting not pulling pain at rectal area in order to improve QOL    Time 10    Period Weeks    Status Achieved      PT LONG TERM GOAL #7   Title Pt will report Bristol Stool Type 4 100% of the time in order to minimize straining and constipation    Baseline Bristol Stool Type 4 75% of the time, Type 5 - 25% of the time.    Time 10    Period Weeks    Status On-going      PT LONG TERM GOAL #8   Title Pt will improve hip abd/ER/flex ( figure-4 )AROM with R from lateral tibial plateau 71 cm,  L 63 cm from floor to knee to R to < 65 cm in order to sexual activities and pelvic exam and don/ dof shoes. ( 06/12/21: 62 cm R)    Time 6    Period Weeks    Status Achieved      PT LONG TERM GOAL  #9   TITLE Pt will demo improved gait mechanics with running to minimzie risk for injuries    Time 8    Period Weeks    Status On-going      PT LONG TERM GOAL  #10   TITLE Pt will demo no more tightness in pelvic floor mm across 2 visits with optimal lengthening in order to improve pain and bowel function    Time 6    Period Weeks    Status New    Target Date 07/25/21                    Plan - 06/12/21 1610     Clinical Impression Statement  Pt has achieved 5/10 goals and progressing well towards remaining goals.   Between her 3rd to 9th visit,  pt reported having larger sized stools and having less fecal leakage from daily to 2 x week. Around her 10th visit, pt was integrating  back to running but with only 6 min 2x day with education on proper stretches for LE and pelvic floor to minimize hypomobility which she had originally presented with. Pt has had a relapse with report of increased fecal leakage and smaller sized stool since this visit .  Today, reassessed goals, troubleshoot factors impacting relapse with leakage. Suggested to return to same diet she had when she was making improvements and to hold off on  running for 1 week  Despite relapse, pt's score on FOTO today still indicated improvements:   _pain from 8pt to 4pts   _bowel leakage from 44pts to 47 pts.   Pt's pelvic girdle and spinal alignment has been addressed. Pt also demonstrates improved mobility and strength in hips along with a more upright posture. Pt demonstrated improved deep core coordination with more optimal relaxation of pelvic floor which is contributing to her progress.  Pt is still requiring Tx to minimize pelvic floor mm tightness and perineal scar restrictions. Anticipate pt will achieve her remaining goals with more skilled Pelvic PT.     Stability/Clinical Decision Making Evolving/Moderate complexity    Rehab Potential Good    PT Frequency 1x / week    PT Duration Other (comment)   10   PT Treatment/Interventions ADLs/Self Care Home Management;Traction;Moist Heat;Therapeutic activities;Functional mobility training;Therapeutic exercise;Gait training;Neuromuscular re-education;Patient/family education;Manual techniques;Manual lymph drainage;Taping;Energy conservation;Scar mobilization;Balance training;Spinal Manipulations;Dry needling;Cryotherapy    Consulted and Agree with Plan of Care Patient             Patient will benefit from skilled therapeutic intervention in order to improve the following deficits and impairments:  Abnormal gait, Decreased coordination, Decreased endurance, Difficulty walking, Decreased range of motion, Decreased strength, Decreased scar mobility, Decreased mobility, Increased muscle spasms, Improper body mechanics, Increased fascial restricitons, Postural dysfunction, Hypomobility, Decreased safety awareness, Decreased balance, Decreased activity tolerance, Impaired flexibility, Pain  Visit Diagnosis: Other lack of coordination  Fecal smearing  Other abnormalities of gait and mobility     Problem List Patient Active Problem List   Diagnosis Date Noted   Encounter for screening  colonoscopy    Hyperlipidemia 01/28/2018   Body mass index (BMI) 19.9 or less, adult 01/28/2018   Dyspareunia in female 01/28/2018   Osteoarthritis of both hands 01/28/2018   Localized osteoporosis without current pathological fracture 09/14/2017   Gastroesophageal reflux disease without esophagitis 09/14/2017    Jerl Mina ,PT, DPT, E-RYT  06/13/2021, 10:43 AM  [place] [address] [place], [place], [address] Phone: [phone]   Fax:  [phone]  Name: Mary Kirk MRN: [id number] Date of Birth: [birth year]

## 2021-06-20 ENCOUNTER — Ambulatory Visit: Payer: BC Managed Care – PPO | Attending: Family Medicine | Admitting: Physical Therapy

## 2021-06-20 ENCOUNTER — Other Ambulatory Visit: Payer: Self-pay

## 2021-06-20 DIAGNOSIS — R151 Fecal smearing: Secondary | ICD-10-CM | POA: Diagnosis present

## 2021-06-20 DIAGNOSIS — R278 Other lack of coordination: Secondary | ICD-10-CM | POA: Diagnosis not present

## 2021-06-20 DIAGNOSIS — M533 Sacrococcygeal disorders, not elsewhere classified: Secondary | ICD-10-CM | POA: Diagnosis not present

## 2021-06-20 DIAGNOSIS — R2689 Other abnormalities of gait and mobility: Secondary | ICD-10-CM | POA: Diagnosis present

## 2021-06-21 NOTE — Therapy (Signed)
[place] MAIN [facility] SERVICES [place], [place], [address] Phone: [phone]   Fax:  [phone]  Physical Therapy Treatment  Patient Details  Name: Mary Kirk MRN: [id number] Date of Birth: 06/07/1956 Referring Provider (PT): Reginia Forts MD   Encounter Date: 06/20/2021   PT End of Session - 06/20/21 1207     Visit Number 11    Date for PT Re-Evaluation 08/21/21   PN 06/12/21   PT Start Time 1200    PT Stop Time 1300    PT Time Calculation (min) 60 min    Activity Tolerance Patient tolerated treatment well    Behavior During Therapy [facility] for tasks assessed/performed             Past Medical History:  Diagnosis Date   Arthritis    GERD (gastroesophageal reflux disease)    IBS (irritable bowel syndrome)    Constipation predominant   Osteoporosis     Past Surgical History:  Procedure Laterality Date   BUNIONECTOMY Bilateral    COLONOSCOPY WITH PROPOFOL N/A 05/22/2018   Procedure: COLONOSCOPY WITH PROPOFOL;  Surgeon: Lucilla Lame, MD;  Location: [employer];  Service: Endoscopy;  Laterality: N/A;    There were no vitals filed for this visit.   Subjective Assessment - 06/20/21 1203     Subjective Pt report t she had the largest sized poop ( size of a ring finger 2" long sausage-like)  than she ever has had in a long time. Pt felt sore after the rectal assessment and it did help to have a bowel movement. Pt documented her food as recommended and resorted back to her old diet when she was making improvements. Pt had no leakage across 4 days. Pt notices the stool shape is large in diameter at first but it narrows as the week progresses and that is when she has the leakage.    Pertinent History 1 vaginal deliveries with tearing, Physical fitness routine: in the summer, water skiing, used to run all the time, walk 2 miles 5 times a week. Hx fof osteoporosis, arthritis    Patient Stated Goals be able to go to the  toilet and take less Miralax and have less painful intercourse                            Pelvic Floor Special Questions - 06/21/21 1448     Pelvic Floor Internal Exam pt consented verbally for rectal assessment and had no contraindications    Exam Type Rectal   R sidelying   Palpation tightness/ tender at 3rd layer at coccyx , L 10-12 o'clock               [facility] Adult PT Treatment/Exercise - 06/21/21 1448       Therapeutic Activites    Other Therapeutic Activities assessed food chart, discussed referral to nutritionist to assist further with her diet, explaiend the role of nn system with GI and pelvic floor, provided relaxation practices with in social environments or stressful events      Neuro Re-ed    Neuro Re-ed Details  cued for mindfulness techniques and breathing techniques for relaxation      Modalities   Modalities Moist Heat      Moist Heat Therapy   Number Minutes Moist Heat 5 Minutes    Moist Heat Location --   sacrum     Manual Therapy   Internal Pelvic  Floor STM/MWM at areas noted in assessment  , lighter pressure when pt c/o tenderness                         PT Long Term Goals - 06/12/21 1611       PT LONG TERM GOAL #1   Title Pt will report complete emptying of bowel movements 100% of the time  in order to improve QOL    Time 4    Period Weeks    Status Revised      PT LONG TERM GOAL #2   Title Pt will complete a food diary    Time 2    Period Weeks    Status Achieved      PT LONG TERM GOAL #3   Title Pt will demo IND with body mechanics to minimzie straining pelvic floor    Time 6    Period Weeks    Status Achieved      PT LONG TERM GOAL #4   Title Pt will demo proper deep core technique and pelvic floor coordination to promote IAP system for postural stability and fecal continence and less pain with intercourse and gynecological exam    Time 5    Period Weeks    Status Partially Met      PT LONG TERM  GOAL #5   Title Pt will demo increased hip abduction strength to > 4/5 in order to  improve tailbone pain and pelvic alignment to minimize slumped sitting ( 6/28/: 5/5)    Time 8    Period Weeks    Status Achieved      Additional Long Term Goals   Additional Long Term Goals Yes      PT LONG TERM GOAL #6   Title Pt will demo proper alignment of coccyx/ SIJ and report no more pain with sitting not pulling pain at rectal area in order to improve QOL    Time 10    Period Weeks    Status Achieved      PT LONG TERM GOAL #7   Title Pt will report Bristol Stool Type 4 100% of the time in order to minimize straining and constipation    Baseline Bristol Stool Type 4 75% of the time, Type 5 - 25% of the time.    Time 10    Period Weeks    Status On-going      PT LONG TERM GOAL #8   Title Pt will improve hip abd/ER/flex ( figure-4 )AROM with R from lateral tibial plateau 71 cm,  L 63 cm from floor to knee to R to < 65 cm in order to sexual activities and pelvic exam and don/ dof shoes. ( 06/12/21: 62 cm R)    Time 6    Period Weeks    Status Achieved      PT LONG TERM GOAL  #9   TITLE Pt will demo improved gait mechanics with running to minimzie risk for injuries    Time 8    Period Weeks    Status On-going      PT LONG TERM GOAL  #10   TITLE Pt will demo no more tightness in pelvic floor mm across 2 visits with optimal lengthening in order to improve pain and bowel function    Time 6    Period Weeks    Status New    Target Date 07/25/21  Plan - 06/20/21 1208     Clinical Impression Statement After last rectal Tx, pt reported  she had the largest sized poop ( size of a ring finger 2" long sausage-like)  than she ever has had in a long time.  Pt documented her food as recommended and resorted back to her old diet when she was making improvements. Pt had no leakage across 4 days. Pt notices the stool shape is large in diameter at first but it narrows as the  week progresses and that is when she has the leakage.   Continued with rectal Tx today and addressed deeper mm that were tight. Pt demo'd decreased tightness post Tx. Discussed biopyschosocial approaches with mindfulness techniques to manage stress and nutritionist referral for long term lifestyle changes.  Perineal scar restrictions and tight deep mm will continue to be addressed at next session. PT continues to benefit from skilled PT    Stability/Clinical Decision Making Evolving/Moderate complexity    Clinical Decision Making Moderate    Rehab Potential Good    PT Frequency 1x / week    PT Duration Other (comment)   10   PT Treatment/Interventions ADLs/Self Care Home Management;Traction;Moist Heat;Therapeutic activities;Functional mobility training;Therapeutic exercise;Gait training;Neuromuscular re-education;Patient/family education;Manual techniques;Manual lymph drainage;Taping;Energy conservation;Scar mobilization;Balance training;Spinal Manipulations;Dry needling;Cryotherapy    Consulted and Agree with Plan of Care Patient             Patient will benefit from skilled therapeutic intervention in order to improve the following deficits and impairments:  Abnormal gait, Decreased coordination, Decreased endurance, Difficulty walking, Decreased range of motion, Decreased strength, Decreased scar mobility, Decreased mobility, Increased muscle spasms, Improper body mechanics, Increased fascial restricitons, Postural dysfunction, Hypomobility, Decreased safety awareness, Decreased balance, Decreased activity tolerance, Impaired flexibility, Pain  Visit Diagnosis: Other lack of coordination  Fecal smearing  Other abnormalities of gait and mobility     Problem List Patient Active Problem List   Diagnosis Date Noted   Encounter for screening colonoscopy    Hyperlipidemia 01/28/2018   Body mass index (BMI) 19.9 or less, adult 01/28/2018   Dyspareunia in female 01/28/2018    Osteoarthritis of both hands 01/28/2018   Localized osteoporosis without current pathological fracture 09/14/2017   Gastroesophageal reflux disease without esophagitis 09/14/2017    Jerl Mina ,PT, DPT, E-RYT  06/21/2021, 2:51 PM  [place] MAIN [facility] SERVICES [address] [place], [place], [address] Phone: [phone]   Fax:  [phone]  Name: Sakeena Teall MRN: [id number] Date of Birth: [birth year]

## 2021-06-25 ENCOUNTER — Ambulatory Visit: Payer: BC Managed Care – PPO | Admitting: Physical Therapy

## 2021-06-29 ENCOUNTER — Ambulatory Visit: Payer: BC Managed Care – PPO | Admitting: Physical Therapy

## 2021-06-29 ENCOUNTER — Other Ambulatory Visit: Payer: Self-pay

## 2021-06-29 DIAGNOSIS — R278 Other lack of coordination: Secondary | ICD-10-CM

## 2021-06-29 DIAGNOSIS — R2689 Other abnormalities of gait and mobility: Secondary | ICD-10-CM

## 2021-06-29 DIAGNOSIS — R151 Fecal smearing: Secondary | ICD-10-CM

## 2021-06-29 NOTE — Therapy (Signed)
[place] MAIN [facility] SERVICES [place], [place], [address] Phone: [phone]   Fax:  [phone]  Physical Therapy Treatment  Patient Details  Name: Mary Kirk MRN: [id number] Date of Birth: 1956/06/17 Referring Provider (PT): Reginia Forts MD   Encounter Date: 06/29/2021   PT End of Session - 06/29/21 1010     Visit Number 12    Date for PT Re-Evaluation 08/21/21   PN 06/12/21   PT Start Time 1003    PT Stop Time 1100    PT Time Calculation (min) 57 min    Activity Tolerance Patient tolerated treatment well    Behavior During Therapy [facility] for tasks assessed/performed             Past Medical History:  Diagnosis Date   Arthritis    GERD (gastroesophageal reflux disease)    IBS (irritable bowel syndrome)    Constipation predominant   Osteoporosis     Past Surgical History:  Procedure Laterality Date   BUNIONECTOMY Bilateral    COLONOSCOPY WITH PROPOFOL N/A 05/22/2018   Procedure: COLONOSCOPY WITH PROPOFOL;  Surgeon: Lucilla Lame, MD;  Location: [place];  Service: Endoscopy;  Laterality: N/A;    There were no vitals filed for this visit.   Subjective Assessment - 06/29/21 1008     Subjective Pt reports she is taking 1.5 cap fulls at night and .5 cap full in morning which is helping her to have larger sized stools in the morning compared to 1 full cap  2 x day. Pt experimented on her own with this and plans to talk to Rich next Thursday.  Pt had leakage everyday except the day she had larger sized poop with the change in Miralax dosage. Since then, there is less leakage.    Pertinent History 1 vaginal deliveries with tearing, Physical fitness routine: in the summer, water skiing, used to run all the time, walk 2 miles 5 times a week. Hx fof osteoporosis, arthritis    Patient Stated Goals be able to go to the toilet and take less Miralax and have less painful intercourse                             Pelvic Floor Special Questions - 06/29/21 1059     Pelvic Floor Internal Exam pt consented verbally for rectal assessment and had no contraindications    Exam Type Rectal   R sidelying   Palpation tightness/ tender at 3rd layer at coccyx , R 12 o'clock               [facility] Adult PT Treatment/Exercise - 06/29/21 1100       Therapeutic Activites    Other Therapeutic Activities discussed the benefit from adding nutritionist and functional medicine doctor to team to help with better formed stools      Neuro Re-ed    Neuro Re-ed Details  cued for anterior tilt , relaxaing feet      Modalities   Modalities Moist Heat      Moist Heat Therapy   Moist Heat Location --   sacrum     Manual Therapy   Internal Pelvic Floor STM/MWM at areas noted in assessment  , lighter pressure when pt c/o tenderness                         PT Long Term Goals - 06/12/21  [place] #1   Title Pt will report complete emptying of bowel movements 100% of the time  in order to improve QOL    Time 4    Period Weeks    Status Revised      PT LONG TERM GOAL #2   Title Pt will complete a food diary    Time 2    Period Weeks    Status Achieved      PT LONG TERM GOAL #3   Title Pt will demo IND with body mechanics to minimzie straining pelvic floor    Time 6    Period Weeks    Status Achieved      PT LONG TERM GOAL #4   Title Pt will demo proper deep core technique and pelvic floor coordination to promote IAP system for postural stability and fecal continence and less pain with intercourse and gynecological exam    Time 5    Period Weeks    Status Partially Met      PT LONG TERM GOAL #5   Title Pt will demo increased hip abduction strength to > 4/5 in order to  improve tailbone pain and pelvic alignment to minimize slumped sitting ( 6/28/: 5/5)    Time 8    Period Weeks    Status Achieved      Additional Long Term Goals    Additional Long Term Goals Yes      PT LONG TERM GOAL #6   Title Pt will demo proper alignment of coccyx/ SIJ and report no more pain with sitting not pulling pain at rectal area in order to improve QOL    Time 10    Period Weeks    Status Achieved      PT LONG TERM GOAL #7   Title Pt will report Bristol Stool Type 4 100% of the time in order to minimize straining and constipation    Baseline Bristol Stool Type 4 75% of the time, Type 5 - 25% of the time.    Time 10    Period Weeks    Status On-going      PT LONG TERM GOAL #8   Title Pt will improve hip abd/ER/flex ( figure-4 )AROM with R from lateral tibial plateau 71 cm,  L 63 cm from floor to knee to R to < 65 cm in order to sexual activities and pelvic exam and don/ dof shoes. ( 06/12/21: 62 cm R)    Time 6    Period Weeks    Status Achieved      PT LONG TERM GOAL  #9   TITLE Pt will demo improved gait mechanics with running to minimzie risk for injuries    Time 8    Period Weeks    Status On-going      PT LONG TERM GOAL  #10   TITLE Pt will demo no more tightness in pelvic floor mm across 2 visits with optimal lengthening in order to improve pain and bowel function    Time 6    Period Weeks    Status New    Target Date 07/25/21                   Plan - 06/29/21 1021     Clinical Impression Statement Pt is demonstrating smaller area of tightness / tenderness through rectal assessment compared to last session. Pt required excessive cues for finding anterior tilt  of pelvis and relaxation of pelvic floor. Highly recommended pt to work with a functional medicine doctor for nutrition and menopause. Provided a list of referrals for functional medicine and nutritionists.Plan to progress with resistance strengthening for maintaining upright posture as pt has had slumped posture prior to [facility] which is associated with posterior tilt of pelvic floor / tighter pelvic floor mm. Pt continues to benefit from skilled PT.     Stability/Clinical Decision Making Evolving/Moderate complexity    Rehab Potential Good    PT Frequency 1x / week    PT Duration Other (comment)   10   PT Treatment/Interventions ADLs/Self Care Home Management;Traction;Moist Heat;Therapeutic activities;Functional mobility training;Therapeutic exercise;Gait training;Neuromuscular re-education;Patient/family education;Manual techniques;Manual lymph drainage;Taping;Energy conservation;Scar mobilization;Balance training;Spinal Manipulations;Dry needling;Cryotherapy    Consulted and Agree with Plan of Care Patient             Patient will benefit from skilled therapeutic intervention in order to improve the following deficits and impairments:  Abnormal gait, Decreased coordination, Decreased endurance, Difficulty walking, Decreased range of motion, Decreased strength, Decreased scar mobility, Decreased mobility, Increased muscle spasms, Improper body mechanics, Increased fascial restricitons, Postural dysfunction, Hypomobility, Decreased safety awareness, Decreased balance, Decreased activity tolerance, Impaired flexibility, Pain  Visit Diagnosis: Other lack of coordination  Fecal smearing  Other abnormalities of gait and mobility     Problem List Patient Active Problem List   Diagnosis Date Noted   Encounter for screening colonoscopy    Hyperlipidemia 01/28/2018   Body mass index (BMI) 19.9 or less, adult 01/28/2018   Dyspareunia in female 01/28/2018   Osteoarthritis of both hands 01/28/2018   Localized osteoporosis without current pathological fracture 09/14/2017   Gastroesophageal reflux disease without esophagitis 09/14/2017    Jerl Mina ,PT, DPT, E-RYT  06/29/2021, 11:07 AM  [place] [address] [place], [place], [address] Phone: [phone]   Fax:  [phone]  Name: Mary Kirk MRN: [id number] Date of Birth: 1956-10-24

## 2021-07-04 ENCOUNTER — Other Ambulatory Visit: Payer: Self-pay

## 2021-07-04 ENCOUNTER — Ambulatory Visit: Payer: BC Managed Care – PPO | Admitting: Physical Therapy

## 2021-07-04 DIAGNOSIS — R151 Fecal smearing: Secondary | ICD-10-CM

## 2021-07-04 DIAGNOSIS — R278 Other lack of coordination: Secondary | ICD-10-CM | POA: Diagnosis not present

## 2021-07-04 DIAGNOSIS — R2689 Other abnormalities of gait and mobility: Secondary | ICD-10-CM

## 2021-07-04 NOTE — Therapy (Signed)
[place] MAIN [facility] SERVICES [place], [place], [address] Phone: [phone]   Fax:  [phone]  Physical Therapy Treatment  Patient Details  Name: Mary Kirk MRN: [id number] Date of Birth: [birth year] Referring Provider (PT): Reginia Forts MD   Encounter Date: 07/04/2021   PT End of Session - 07/04/21 1217     Visit Number 13    Date for PT Re-Evaluation 08/21/21   PN 06/12/21   PT Start Time 1205    PT Stop Time 1300    PT Time Calculation (min) 55 min    Activity Tolerance Patient tolerated treatment well    Behavior During Therapy [facility] for tasks assessed/performed             Past Medical History:  Diagnosis Date   Arthritis    GERD (gastroesophageal reflux disease)    IBS (irritable bowel syndrome)    Constipation predominant   Osteoporosis     Past Surgical History:  Procedure Laterality Date   BUNIONECTOMY Bilateral    COLONOSCOPY WITH PROPOFOL N/A 05/22/2018   Procedure: COLONOSCOPY WITH PROPOFOL;  Surgeon: Lucilla Lame, MD;  Location: [place];  Service: Endoscopy;  Laterality: N/A;    There were no vitals filed for this visit.   Subjective Assessment - 07/04/21 1209     Subjective Pt reported she had days with no leakage    Pertinent History 1 vaginal deliveries with tearing, Physical fitness routine: in the summer, water skiing, used to run all the time, walk 2 miles 5 times a week. Hx fof osteoporosis, arthritis    Patient Stated Goals be able to go to the toilet and take less Miralax and have less painful intercourse                            Pelvic Floor Special Questions - 07/04/21 1305     Pelvic Floor Internal Exam pt consented verbally for rectal assessment and had no contraindications    Exam Type Vaginal    Palpation tightness at ischiococcygeus. iliococcygeus B, less tenderness , tightness L > R    Strength fair squeeze, definite lift                [facility] Adult PT Treatment/Exercise - 07/04/21 1309       Therapeutic Activites    Other Therapeutic Activities discussed the benefit fro ading nutritionist and functional medicine doctor to team to help with better formed stools      Neuro Re-ed    Neuro Re-ed Details  cued for less posterior tilt , feet co-activation      Modalities   Modalities Moist Heat      Moist Heat Therapy   Number Minutes Moist Heat 5 Minutes    Moist Heat Location --   perineum     Manual Therapy   Internal Pelvic Floor STM/MWM at areas noted in assessment  , lighter pressure when pt c/o tenderness                         PT Long Term Goals - 06/12/21 1611       PT LONG TERM GOAL #1   Title Pt will report complete emptying of bowel movements 100% of the time  in order to improve QOL    Time 4    Period Weeks    Status Revised  PT LONG TERM GOAL #2   Title Pt will complete a food diary    Time 2    Period Weeks    Status Achieved      PT LONG TERM GOAL #3   Title Pt will demo IND with body mechanics to minimzie straining pelvic floor    Time 6    Period Weeks    Status Achieved      PT LONG TERM GOAL #4   Title Pt will demo proper deep core technique and pelvic floor coordination to promote IAP system for postural stability and fecal continence and less pain with intercourse and gynecological exam    Time 5    Period Weeks    Status Partially Met      PT LONG TERM GOAL #5   Title Pt will demo increased hip abduction strength to > 4/5 in order to  improve tailbone pain and pelvic alignment to minimize slumped sitting ( 6/28/: 5/5)    Time 8    Period Weeks    Status Achieved      Additional Long Term Goals   Additional Long Term Goals Yes      PT LONG TERM GOAL #6   Title Pt will demo proper alignment of coccyx/ SIJ and report no more pain with sitting not pulling pain at rectal area in order to improve QOL    Time 10    Period Weeks    Status Achieved       PT LONG TERM GOAL #7   Title Pt will report Bristol Stool Type 4 100% of the time in order to minimize straining and constipation    Baseline Bristol Stool Type 4 75% of the time, Type 5 - 25% of the time.    Time 10    Period Weeks    Status On-going      PT LONG TERM GOAL #8   Title Pt will improve hip abd/ER/flex ( figure-4 )AROM with R from lateral tibial plateau 71 cm,  L 63 cm from floor to knee to R to < 65 cm in order to sexual activities and pelvic exam and don/ dof shoes. ( 06/12/21: 62 cm R)    Time 6    Period Weeks    Status Achieved      PT LONG TERM GOAL  #9   TITLE Pt will demo improved gait mechanics with running to minimzie risk for injuries    Time 8    Period Weeks    Status On-going      PT LONG TERM GOAL  #10   TITLE Pt will demo no more tightness in pelvic floor mm across 2 visits with optimal lengthening in order to improve pain and bowel function    Time 6    Period Weeks    Status New    Target Date 07/25/21                   Plan - 07/04/21 1218     Clinical Impression Statement Pt is making progress with lengthened pelvic floor and has increased propioception for anterior tilt of pelvic with less cues. However, pt still required cues for less abdominal overuse in exhalation and less posterior pelvic tilt to minimize tightening pelvic floor. Pt continues to benefit from skilled PT   Stability/Clinical Decision Making Evolving/Moderate complexity    Clinical Decision Making Moderate    Rehab Potential Good    PT Frequency 1x / week  PT Duration Other (comment)   10   PT Treatment/Interventions ADLs/Self Care Home Management;Traction;Moist Heat;Therapeutic activities;Functional mobility training;Therapeutic exercise;Gait training;Neuromuscular re-education;Patient/family education;Manual techniques;Manual lymph drainage;Taping;Energy conservation;Scar mobilization;Balance training;Spinal Manipulations;Dry needling;Cryotherapy    Consulted and  Agree with Plan of Care Patient             Patient will benefit from skilled therapeutic intervention in order to improve the following deficits and impairments:  Abnormal gait, Decreased coordination, Decreased endurance, Difficulty walking, Decreased range of motion, Decreased strength, Decreased scar mobility, Decreased mobility, Increased muscle spasms, Improper body mechanics, Increased fascial restricitons, Postural dysfunction, Hypomobility, Decreased safety awareness, Decreased balance, Decreased activity tolerance, Impaired flexibility, Pain  Visit Diagnosis: Other lack of coordination  Fecal smearing  Other abnormalities of gait and mobility     Problem List Patient Active Problem List   Diagnosis Date Noted   Encounter for screening colonoscopy    Hyperlipidemia 01/28/2018   Body mass index (BMI) 19.9 or less, adult 01/28/2018   Dyspareunia in female 01/28/2018   Osteoarthritis of both hands 01/28/2018   Localized osteoporosis without current pathological fracture 09/14/2017   Gastroesophageal reflux disease without esophagitis 09/14/2017    Jerl Mina ,PT, DPT, E-RYT  07/04/2021, 1:12 PM  [place] MAIN [facility] SERVICES [address] [place], [place], [address] Phone: [phone]   Fax:  [phone]  Name: Mary Kirk MRN: [id number] Date of Birth: August 02, 1956

## 2021-07-09 ENCOUNTER — Encounter: Payer: BC Managed Care – PPO | Admitting: Physical Therapy

## 2021-07-12 ENCOUNTER — Other Ambulatory Visit: Payer: Self-pay

## 2021-07-12 ENCOUNTER — Ambulatory Visit: Payer: BC Managed Care – PPO | Admitting: Physical Therapy

## 2021-07-12 DIAGNOSIS — R278 Other lack of coordination: Secondary | ICD-10-CM | POA: Diagnosis not present

## 2021-07-12 DIAGNOSIS — R2689 Other abnormalities of gait and mobility: Secondary | ICD-10-CM

## 2021-07-12 DIAGNOSIS — R151 Fecal smearing: Secondary | ICD-10-CM

## 2021-07-13 NOTE — Patient Instructions (Signed)
Lengthening of pelvic floor with deep core

## 2021-07-13 NOTE — Therapy (Signed)
[place] MAIN [facility] SERVICES [place], [place], [address] Phone: [phone]   Fax:  [phone]  Physical Therapy Treatment  Patient Details  Name: Mary Kirk MRN: [id number] Date of Birth: 16-Nov-1956 Referring Provider (PT): Reginia Forts MD   Encounter Date: 07/12/2021   PT End of Session - 07/13/21 1057     Visit Number 14    Date for PT Re-Evaluation 08/21/21   PN 06/12/21   PT Start Time 1400    PT Stop Time 1500    PT Time Calculation (min) 60 min    Activity Tolerance Patient tolerated treatment well    Behavior During Therapy [facility] for tasks assessed/performed             Past Medical History:  Diagnosis Date   Arthritis    GERD (gastroesophageal reflux disease)    IBS (irritable bowel syndrome)    Constipation predominant   Osteoporosis     Past Surgical History:  Procedure Laterality Date   BUNIONECTOMY Bilateral    COLONOSCOPY WITH PROPOFOL N/A 05/22/2018   Procedure: COLONOSCOPY WITH PROPOFOL;  Surgeon: Lucilla Lame, MD;  Location: [place];  Service: Endoscopy;  Laterality: N/A;    There were no vitals filed for this visit.   Subjective Assessment - 07/12/21 1409     Subjective Pt reported she had 4 days with no leakage but bowels were looser but she was travelling those days and her stomach gets upset (acide reflux, pain and bloating)   when she travels  and eat less.Pt returned home and to her regular diet, maintained walking and had better formed stools but leakage occured those days .  Pt had 2 days of smearing and 2 days of wiping leakage.    Pertinent History 1 vaginal deliveries with tearing, Physical fitness routine: in the summer, water skiing, used to run all the time, walk 2 miles 5 times a week. Hx fof osteoporosis, arthritis    Patient Stated Goals be able to go to the toilet and take less Miralax and have less painful intercourse                [facility] PT Assessment -  07/12/21 1452       Coordination   Coordination and Movement Description no cues for anterior tilt pelvic tilt                        Pelvic Floor Special Questions - 07/13/21 1055     Pelvic Floor Internal Exam pt consented verbally for rectal assessment and had no contraindications    Exam Type Vaginal    Palpation tightness at L anterior puborectalis    Strength fair squeeze, definite lift               [facility] Adult PT Treatment/Exercise - 07/13/21 1055       Therapeutic Activites    Other Therapeutic Activities --      Neuro Re-ed    Neuro Re-ed Details  cued for lengthening with deep core      Modalities   Modalities Moist Heat      Moist Heat Therapy   Moist Heat Location --   perineum     Manual Therapy   Internal Pelvic Floor STM/MWM at areas noted in assessment  , lighter pressure when pt c/o tenderness  PT Long Term Goals - 06/12/21 1611       PT LONG TERM GOAL #1   Title Pt will report complete emptying of bowel movements 100% of the time  in order to improve QOL    Time 4    Period Weeks    Status Revised      PT LONG TERM GOAL #2   Title Pt will complete a food diary    Time 2    Period Weeks    Status Achieved      PT LONG TERM GOAL #3   Title Pt will demo IND with body mechanics to minimzie straining pelvic floor    Time 6    Period Weeks    Status Achieved      PT LONG TERM GOAL #4   Title Pt will demo proper deep core technique and pelvic floor coordination to promote IAP system for postural stability and fecal continence and less pain with intercourse and gynecological exam    Time 5    Period Weeks    Status Partially Met      PT LONG TERM GOAL #5   Title Pt will demo increased hip abduction strength to > 4/5 in order to  improve tailbone pain and pelvic alignment to minimize slumped sitting ( 6/28/: 5/5)    Time 8    Period Weeks    Status Achieved      Additional Long Term  Goals   Additional Long Term Goals Yes      PT LONG TERM GOAL #6   Title Pt will demo proper alignment of coccyx/ SIJ and report no more pain with sitting not pulling pain at rectal area in order to improve QOL    Time 10    Period Weeks    Status Achieved      PT LONG TERM GOAL #7   Title Pt will report Bristol Stool Type 4 100% of the time in order to minimize straining and constipation    Baseline [place] Stool Type 4 75% of the time, Type 5 - 25% of the time.    Time 10    Period Weeks    Status On-going      PT LONG TERM GOAL #8   Title Pt will improve hip abd/ER/flex ( figure-4 )AROM with R from lateral tibial plateau 71 cm,  L 63 cm from floor to knee to R to < 65 cm in order to sexual activities and pelvic exam and don/ dof shoes. ( 06/12/21: 62 cm R)    Time 6    Period Weeks    Status Achieved      PT LONG TERM GOAL  #9   TITLE Pt will demo improved gait mechanics with running to minimzie risk for injuries    Time 8    Period Weeks    Status On-going      PT LONG TERM GOAL  #10   TITLE Pt will demo no more tightness in pelvic floor mm across 2 visits with optimal lengthening in order to improve pain and bowel function    Time 6    Period Weeks    Status New    Target Date 07/25/21                   Plan - 07/13/21 1110     Clinical Impression Statement Pt  continues to show diminishing pelvic floor tightness which is correlating with her report of no leakage  across 4 days. Pt required internal manual Tx to minimize anterior mm tightness. Pt continues to benefit from skilled PT and plan to advance towards resistance bands next session.    Stability/Clinical Decision Making Evolving/Moderate complexity    Rehab Potential Good    PT Frequency 1x / week    PT Duration Other (comment)   10   PT Treatment/Interventions ADLs/Self Care Home Management;Traction;Moist Heat;Therapeutic activities;Functional mobility training;Therapeutic exercise;Gait  training;Neuromuscular re-education;Patient/family education;Manual techniques;Manual lymph drainage;Taping;Energy conservation;Scar mobilization;Balance training;Spinal Manipulations;Dry needling;Cryotherapy    Consulted and Agree with Plan of Care Patient             Patient will benefit from skilled therapeutic intervention in order to improve the following deficits and impairments:  Abnormal gait, Decreased coordination, Decreased endurance, Difficulty walking, Decreased range of motion, Decreased strength, Decreased scar mobility, Decreased mobility, Increased muscle spasms, Improper body mechanics, Increased fascial restricitons, Postural dysfunction, Hypomobility, Decreased safety awareness, Decreased balance, Decreased activity tolerance, Impaired flexibility, Pain  Visit Diagnosis: Fecal smearing  Other lack of coordination  Other abnormalities of gait and mobility     Problem List Patient Active Problem List   Diagnosis Date Noted   Encounter for screening colonoscopy    Hyperlipidemia 01/28/2018   Body mass index (BMI) 19.9 or less, adult 01/28/2018   Dyspareunia in female 01/28/2018   Osteoarthritis of both hands 01/28/2018   Localized osteoporosis without current pathological fracture 09/14/2017   Gastroesophageal reflux disease without esophagitis 09/14/2017    Jerl Mina ,PT, DPT, E-RYT  07/13/2021, 11:11 AM  [facility] [address] [place], [place], [address] Phone: [phone]   Fax:  [phone]  Name: Mary Kirk MRN: [id number] Date of Birth: 1956/04/30

## 2021-07-18 ENCOUNTER — Encounter: Payer: BC Managed Care – PPO | Admitting: Physical Therapy

## 2021-07-18 ENCOUNTER — Ambulatory Visit: Payer: BC Managed Care – PPO | Attending: Family Medicine | Admitting: Physical Therapy

## 2021-07-18 ENCOUNTER — Other Ambulatory Visit: Payer: Self-pay

## 2021-07-18 DIAGNOSIS — R151 Fecal smearing: Secondary | ICD-10-CM | POA: Insufficient documentation

## 2021-07-18 DIAGNOSIS — R2689 Other abnormalities of gait and mobility: Secondary | ICD-10-CM | POA: Insufficient documentation

## 2021-07-18 DIAGNOSIS — M533 Sacrococcygeal disorders, not elsewhere classified: Secondary | ICD-10-CM | POA: Insufficient documentation

## 2021-07-18 DIAGNOSIS — R278 Other lack of coordination: Secondary | ICD-10-CM | POA: Insufficient documentation

## 2021-07-18 NOTE — Therapy (Signed)
[place] MAIN [facility] SERVICES [place], [place], [address] Phone: [phone]   Fax:  [phone]  Physical Therapy Treatment  Patient Details  Name: Mary Kirk MRN: [id number] Date of Birth: 08/20/1956 Referring Provider (PT): Reginia Forts MD   Encounter Date: 07/18/2021   PT End of Session - 07/18/21 1109     Visit Number 15    Date for PT Re-Evaluation 08/21/21   PN 06/12/21   PT Start Time 1105    PT Stop Time 1200    PT Time Calculation (min) 55 min    Activity Tolerance Patient tolerated treatment well    Behavior During Therapy [facility] for tasks assessed/performed             Past Medical History:  Diagnosis Date   Arthritis    GERD (gastroesophageal reflux disease)    IBS (irritable bowel syndrome)    Constipation predominant   Osteoporosis     Past Surgical History:  Procedure Laterality Date   BUNIONECTOMY Bilateral    COLONOSCOPY WITH PROPOFOL N/A 05/22/2018   Procedure: COLONOSCOPY WITH PROPOFOL;  Surgeon: Lucilla Lame, MD;  Location: [place];  Service: Endoscopy;  Laterality: N/A;    There were no vitals filed for this visit.   Subjective Assessment - 07/18/21 1110     Subjective Pt reported 2 days of no leakage, 1 day of leakage, 3 days of smearing.    Pertinent History 1 vaginal deliveries with tearing, Physical fitness routine: in the summer, water skiing, used to run all the time, walk 2 miles 5 times a week. Hx fof osteoporosis, arthritis    Patient Stated Goals be able to go to the toilet and take less Miralax and have less painful intercourse                [facility] PT Assessment - 07/18/21 1210       Coordination   Coordination and Movement Description poor diassociation between trunk and BLE , posterior COM                        Pelvic Floor Special Questions - 07/18/21 1110     Pelvic Floor Internal Exam pt consented verbally for rectal assessment and  had no contraindications    Exam Type Vaginal    Palpation tightness at R ATLA, ischial rami    Strength fair squeeze, definite lift               [facility] Adult PT Treatment/Exercise - 07/18/21 1210       Neuro Re-ed    Neuro Re-ed Details  excesive cues for more anterior COM and multifidis twist technique exercise      Modalities   Modalities Moist Heat      Moist Heat Therapy   Number Minutes Moist Heat 5 Minutes    Moist Heat Location --   sacrum/ prone, hip ER     Manual Therapy   Internal Pelvic Floor STM/MWM at areas noted in assessment  , lighter pressure when pt c/o tenderness                         PT Long Term Goals - 06/12/21 1611       PT LONG TERM GOAL #1   Title Pt will report complete emptying of bowel movements 100% of the time  in order to improve QOL    Time 4  Period Weeks    Status Revised      PT LONG TERM GOAL #2   Title Pt will complete a food diary    Time 2    Period Weeks    Status Achieved      PT LONG TERM GOAL #3   Title Pt will demo IND with body mechanics to minimzie straining pelvic floor    Time 6    Period Weeks    Status Achieved      PT LONG TERM GOAL #4   Title Pt will demo proper deep core technique and pelvic floor coordination to promote IAP system for postural stability and fecal continence and less pain with intercourse and gynecological exam    Time 5    Period Weeks    Status Partially Met      PT LONG TERM GOAL #5   Title Pt will demo increased hip abduction strength to > 4/5 in order to  improve tailbone pain and pelvic alignment to minimize slumped sitting ( 6/28/: 5/5)    Time 8    Period Weeks    Status Achieved      Additional Long Term Goals   Additional Long Term Goals Yes      PT LONG TERM GOAL #6   Title Pt will demo proper alignment of coccyx/ SIJ and report no more pain with sitting not pulling pain at rectal area in order to improve QOL    Time 10    Period Weeks    Status  Achieved      PT LONG TERM GOAL #7   Title Pt will report Bristol Stool Type 4 100% of the time in order to minimize straining and constipation    Baseline Bristol Stool Type 4 75% of the time, Type 5 - 25% of the time.    Time 10    Period Weeks    Status On-going      PT LONG TERM GOAL #8   Title Pt will improve hip abd/ER/flex ( figure-4 )AROM with R from lateral tibial plateau 71 cm,  L 63 cm from floor to knee to R to < 65 cm in order to sexual activities and pelvic exam and don/ dof shoes. ( 06/12/21: 62 cm R)    Time 6    Period Weeks    Status Achieved      PT LONG TERM GOAL  #9   TITLE Pt will demo improved gait mechanics with running to minimzie risk for injuries    Time 8    Period Weeks    Status On-going      PT LONG TERM GOAL  #10   TITLE Pt will demo no more tightness in pelvic floor mm across 2 visits with optimal lengthening in order to improve pain and bowel function    Time 6    Period Weeks    Status New    Target Date 07/25/21                   Plan - 07/18/21 1109     Clinical Impression Statement Pt shows decreasing pelvic floor tightness. Today's internal Tx focused on R deeper lateral and posterior areas. Pt demo'd impoved mobility of pelvic floor post Tx. Lighter pressure and movement wih mobilizaiton was applied to minimize tenderness. Initiated pt to multifidis strengthening and pt required excessive cues for diassociation of trunk and pelvis and anterior COM with less WBing in heels. Pt demo'd correctly post  Tx, Pt continues to benefit from skilled PT    Stability/Clinical Decision Making Evolving/Moderate complexity    Rehab Potential Good    PT Frequency 1x / week    PT Duration Other (comment)   10   PT Treatment/Interventions ADLs/Self Care Home Management;Traction;Moist Heat;Therapeutic activities;Functional mobility training;Therapeutic exercise;Gait training;Neuromuscular re-education;Patient/family education;Manual techniques;Manual  lymph drainage;Taping;Energy conservation;Scar mobilization;Balance training;Spinal Manipulations;Dry needling;Cryotherapy    Consulted and Agree with Plan of Care Patient             Patient will benefit from skilled therapeutic intervention in order to improve the following deficits and impairments:  Abnormal gait, Decreased coordination, Decreased endurance, Difficulty walking, Decreased range of motion, Decreased strength, Decreased scar mobility, Decreased mobility, Increased muscle spasms, Improper body mechanics, Increased fascial restricitons, Postural dysfunction, Hypomobility, Decreased safety awareness, Decreased balance, Decreased activity tolerance, Impaired flexibility, Pain  Visit Diagnosis: Fecal smearing  Other abnormalities of gait and mobility  Other lack of coordination     Problem List Patient Active Problem List   Diagnosis Date Noted   Encounter for screening colonoscopy    Hyperlipidemia 01/28/2018   Body mass index (BMI) 19.9 or less, adult 01/28/2018   Dyspareunia in female 01/28/2018   Osteoarthritis of both hands 01/28/2018   Localized osteoporosis without current pathological fracture 09/14/2017   Gastroesophageal reflux disease without esophagitis 09/14/2017    Jerl Mina ,PT, DPT, E-RYT  07/18/2021, 12:12 PM  [facility] [address] [place], [place], [address] Phone: [phone]   Fax:  [phone]  Name: Mary Kirk MRN: [id number] Date of Birth: [birth year]

## 2021-07-18 NOTE — Patient Instructions (Signed)
Multifidis twist  °Band is on doorknob: stand further away from door (facing perpendicular)  ° °Twisting trunk without moving the hips and knees °Hold band at the level of ribcage, elbows bent,shoulder blades roll back and down like squeezing a pencil under armpit °  ° °Exhale twist,.10-15 deg away from door without moving your hips/ knees. Continue to maintain equal weight through legs. Keep knee unlocked.  °10 x 2  ° °

## 2021-07-25 ENCOUNTER — Ambulatory Visit: Payer: BC Managed Care – PPO | Admitting: Physical Therapy

## 2021-08-01 ENCOUNTER — Other Ambulatory Visit: Payer: Self-pay

## 2021-08-01 ENCOUNTER — Ambulatory Visit: Payer: BC Managed Care – PPO | Admitting: Physical Therapy

## 2021-08-01 DIAGNOSIS — M533 Sacrococcygeal disorders, not elsewhere classified: Secondary | ICD-10-CM

## 2021-08-01 DIAGNOSIS — R151 Fecal smearing: Secondary | ICD-10-CM | POA: Diagnosis not present

## 2021-08-01 DIAGNOSIS — R278 Other lack of coordination: Secondary | ICD-10-CM

## 2021-08-01 DIAGNOSIS — R2689 Other abnormalities of gait and mobility: Secondary | ICD-10-CM

## 2021-08-01 NOTE — Patient Instructions (Signed)
Backward lunges Front knee in place above ankle, back foot and toes pointed forward, ( do not turn the hips and toes out) . Heel is up to be able to push off and return R foot next to the L at hip width apart  Carry your center with you as you step to maintain 70% weight in front and 30% in back  Front R knee is align 2nd-3rd toe line  2 min  Then with the band  __  W squats with Handiband 20 rep

## 2021-08-01 NOTE — Therapy (Signed)
[place] MAIN [facility] SERVICES [place], [place], [address] Phone: [phone]   Fax:  [phone]  Physical Therapy Treatment  Patient Details  Name: Mary Kirk MRN: [id number] Date of Birth: 1956/07/01 Referring Provider (PT): Reginia Forts MD   Encounter Date: 08/01/2021   PT End of Session - 08/01/21 1205     Visit Number 16    Date for PT Re-Evaluation 08/21/21   PN 06/12/21   PT Start Time 1200    PT Stop Time 1300    PT Time Calculation (min) 60 min    Activity Tolerance Patient tolerated treatment well    Behavior During Therapy [facility] for tasks assessed/performed             Past Medical History:  Diagnosis Date   Arthritis    GERD (gastroesophageal reflux disease)    IBS (irritable bowel syndrome)    Constipation predominant   Osteoporosis     Past Surgical History:  Procedure Laterality Date   BUNIONECTOMY Bilateral    COLONOSCOPY WITH PROPOFOL N/A 05/22/2018   Procedure: COLONOSCOPY WITH PROPOFOL;  Surgeon: Lucilla Lame, MD;  Location: [place];  Service: Endoscopy;  Laterality: N/A;    There were no vitals filed for this visit.   Subjective Assessment - 08/01/21 1204     Subjective Pt reported she had 10 days of no smears nor leakage. Pt's BMs were more consistent    Pertinent History 1 vaginal deliveries with tearing, Physical fitness routine: in the summer, water skiing, used to run all the time, walk 2 miles 5 times a week. Hx fof osteoporosis, arthritis    Patient Stated Goals be able to go to the toilet and take less Miralax and have less painful intercourse                            Pelvic Floor Special Questions - 08/01/21 1235     Pelvic Floor Internal Exam pt consented verbally for rectal assessment and had no contraindications    Exam Type Vaginal    Palpation tightness at L ischiocavernosus/bulbopsongiosus    Strength fair squeeze, definite lift                [facility] Adult PT Treatment/Exercise - 08/01/21 1235       Neuro Re-ed    Neuro Re-ed Details  excesive cues for upright HEP with handibands      Modalities   Modalities Moist Heat      Moist Heat Therapy   Moist Heat Location --   sacrum/ prone, hip ER     Manual Therapy   Internal Pelvic Floor STM/MWM at areas noted in assessment  , lighter pressure when pt c/o tenderness                         PT Long Term Goals - 06/12/21 1611       PT LONG TERM GOAL #1   Title Pt will report complete emptying of bowel movements 100% of the time  in order to improve QOL    Time 4    Period Weeks    Status Revised      PT LONG TERM GOAL #2   Title Pt will complete a food diary    Time 2    Period Weeks    Status Achieved      PT LONG TERM GOAL #  3   Title Pt will demo IND with body mechanics to minimzie straining pelvic floor    Time 6    Period Weeks    Status Achieved      PT LONG TERM GOAL #4   Title Pt will demo proper deep core technique and pelvic floor coordination to promote IAP system for postural stability and fecal continence and less pain with intercourse and gynecological exam    Time 5    Period Weeks    Status Partially Met      PT LONG TERM GOAL #5   Title Pt will demo increased hip abduction strength to > 4/5 in order to  improve tailbone pain and pelvic alignment to minimize slumped sitting ( 6/28/: 5/5)    Time 8    Period Weeks    Status Achieved      Additional Long Term Goals   Additional Long Term Goals Yes      PT LONG TERM GOAL #6   Title Pt will demo proper alignment of coccyx/ SIJ and report no more pain with sitting not pulling pain at rectal area in order to improve QOL    Time 10    Period Weeks    Status Achieved      PT LONG TERM GOAL #7   Title Pt will report Bristol Stool Type 4 100% of the time in order to minimize straining and constipation    Baseline Bristol Stool Type 4 75% of the time, Type 5 - 25% of the  time.    Time 10    Period Weeks    Status On-going      PT LONG TERM GOAL #8   Title Pt will improve hip abd/ER/flex ( figure-4 )AROM with R from lateral tibial plateau 71 cm,  L 63 cm from floor to knee to R to < 65 cm in order to sexual activities and pelvic exam and don/ dof shoes. ( 06/12/21: 62 cm R)    Time 6    Period Weeks    Status Achieved      PT LONG TERM GOAL  #9   TITLE Pt will demo improved gait mechanics with running to minimzie risk for injuries    Time 8    Period Weeks    Status On-going      PT LONG TERM GOAL  #10   TITLE Pt will demo no more tightness in pelvic floor mm across 2 visits with optimal lengthening in order to improve pain and bowel function    Time 6    Period Weeks    Status New    Target Date 07/25/21                   Plan - 08/01/21 1206     Clinical Impression Statement Pt had a milestone achievement of no leakage nor smearing across 10 days but not in a row across the past 14 days.   Today Pt showed tensions only at anterior mm of pelvic floor at a much smaller area compared to entire pelvicfloor at all layers. Pt demo'd decreased tensions post Tx at these areas. Progressed pt to resistance strengthening but pt required cues for propioception of feet, knees, pelvis to minimize overactivity of pelvic floor and optimize balance. Pt continues to benefit from skilled PT    Stability/Clinical Decision Making Evolving/Moderate complexity    Clinical Decision Making Moderate    Rehab Potential Good    PT Frequency 1x /  week    PT Duration Other (comment)   10   PT Treatment/Interventions ADLs/Self Care Home Management;Traction;Moist Heat;Therapeutic activities;Functional mobility training;Therapeutic exercise;Gait training;Neuromuscular re-education;Patient/family education;Manual techniques;Manual lymph drainage;Taping;Energy conservation;Scar mobilization;Balance training;Spinal Manipulations;Dry needling;Cryotherapy    Consulted and  Agree with Plan of Care Patient             Patient will benefit from skilled therapeutic intervention in order to improve the following deficits and impairments:  Abnormal gait, Decreased coordination, Decreased endurance, Difficulty walking, Decreased range of motion, Decreased strength, Decreased scar mobility, Decreased mobility, Increased muscle spasms, Improper body mechanics, Increased fascial restricitons, Postural dysfunction, Hypomobility, Decreased safety awareness, Decreased balance, Decreased activity tolerance, Impaired flexibility, Pain  Visit Diagnosis: Fecal smearing  Sacrococcygeal disorders, not elsewhere classified  Other abnormalities of gait and mobility  Other lack of coordination     Problem List Patient Active Problem List   Diagnosis Date Noted   Encounter for screening colonoscopy    Hyperlipidemia 01/28/2018   Body mass index (BMI) 19.9 or less, adult 01/28/2018   Dyspareunia in female 01/28/2018   Osteoarthritis of both hands 01/28/2018   Localized osteoporosis without current pathological fracture 09/14/2017   Gastroesophageal reflux disease without esophagitis 09/14/2017    Jerl Mina ,PT, DPT, E-RYT  08/01/2021, 1:57 PM  [place] MAIN [facility] SERVICES [address] [place], [place], [address] Phone: [phone]   Fax:  [phone]  Name: Mary Kirk MRN: [id number] Date of Birth: [birth year]

## 2021-08-07 ENCOUNTER — Encounter: Payer: BC Managed Care – PPO | Admitting: Physical Therapy

## 2021-08-15 ENCOUNTER — Ambulatory Visit: Payer: BC Managed Care – PPO | Admitting: Physical Therapy

## 2021-08-15 ENCOUNTER — Other Ambulatory Visit: Payer: Self-pay

## 2021-08-15 DIAGNOSIS — R151 Fecal smearing: Secondary | ICD-10-CM | POA: Diagnosis not present

## 2021-08-15 DIAGNOSIS — M533 Sacrococcygeal disorders, not elsewhere classified: Secondary | ICD-10-CM

## 2021-08-15 DIAGNOSIS — R2689 Other abnormalities of gait and mobility: Secondary | ICD-10-CM

## 2021-08-15 DIAGNOSIS — R278 Other lack of coordination: Secondary | ICD-10-CM

## 2021-08-15 NOTE — Therapy (Signed)
[place] MAIN [facility] SERVICES [place], [place], [address] Phone: [phone]   Fax:  [phone]  Physical Therapy Treatment  Patient Details  Name: Mary Kirk MRN: [id number] Date of Birth: [birth year] Referring Provider (PT): Reginia Forts MD   Encounter Date: 08/15/2021   PT End of Session - 08/15/21 1411     Visit Number 17    Date for PT Re-Evaluation 08/21/21   PN 06/12/21   PT Start Time 1204    PT Stop Time 1300    PT Time Calculation (min) 56 min    Activity Tolerance Patient tolerated treatment well    Behavior During Therapy [facility] for tasks assessed/performed             Past Medical History:  Diagnosis Date   Arthritis    GERD (gastroesophageal reflux disease)    IBS (irritable bowel syndrome)    Constipation predominant   Osteoporosis     Past Surgical History:  Procedure Laterality Date   BUNIONECTOMY Bilateral    COLONOSCOPY WITH PROPOFOL N/A 05/22/2018   Procedure: COLONOSCOPY WITH PROPOFOL;  Surgeon: Lucilla Lame, MD;  Location: [place];  Service: Endoscopy;  Laterality: N/A;    There were no vitals filed for this visit.   Subjective Assessment - 08/15/21 1208     Subjective Pt reported she had 8 days of no leakage / smearing and then 6 days of smearing across 14 days.  Pt travelled long hours in the car and played with granddtrs at the beach and her house these past 2 weeks.    Pertinent History 1 vaginal deliveries with tearing, Physical fitness routine: in the summer, water skiing, used to run all the time, walk 2 miles 5 times a week. Hx fof osteoporosis, arthritis    Patient Stated Goals be able to go to the toilet and take less Miralax and have less painful intercourse                [facility] PT Assessment - 08/15/21 1410       Palpation   SI assessment  L SIJ hypomobile                           [facility] Adult PT Treatment/Exercise - 08/15/21 1409        Neuro Re-ed    Neuro Re-ed Details  cued for L SIJ stretch      Modalities   Modalities Moist Heat      Moist Heat Therapy   Moist Heat Location --   perineum     Manual Therapy   Internal Pelvic Floor STM/MWM at areas noted in assessment  to promote nutation of L SIJ                         PT Long Term Goals - 06/12/21 1611       PT LONG TERM GOAL #1   Title Pt will report complete emptying of bowel movements 100% of the time  in order to improve QOL    Time 4    Period Weeks    Status Revised      PT LONG TERM GOAL #2   Title Pt will complete a food diary    Time 2    Period Weeks    Status Achieved      PT LONG TERM GOAL #3   Title Pt will  demo IND with body mechanics to minimzie straining pelvic floor    Time 6    Period Weeks    Status Achieved      PT LONG TERM GOAL #4   Title Pt will demo proper deep core technique and pelvic floor coordination to promote IAP system for postural stability and fecal continence and less pain with intercourse and gynecological exam    Time 5    Period Weeks    Status Partially Met      PT LONG TERM GOAL #5   Title Pt will demo increased hip abduction strength to > 4/5 in order to  improve tailbone pain and pelvic alignment to minimize slumped sitting ( 6/28/: 5/5)    Time 8    Period Weeks    Status Achieved      Additional Long Term Goals   Additional Long Term Goals Yes      PT LONG TERM GOAL #6   Title Pt will demo proper alignment of coccyx/ SIJ and report no more pain with sitting not pulling pain at rectal area in order to improve QOL    Time 10    Period Weeks    Status Achieved      PT LONG TERM GOAL #7   Title Pt will report Bristol Stool Type 4 100% of the time in order to minimize straining and constipation    Baseline Bristol Stool Type 4 75% of the time, Type 5 - 25% of the time.    Time 10    Period Weeks    Status On-going      PT LONG TERM GOAL #8   Title Pt will improve hip  abd/ER/flex ( figure-4 )AROM with R from lateral tibial plateau 71 cm,  L 63 cm from floor to knee to R to < 65 cm in order to sexual activities and pelvic exam and don/ dof shoes. ( 06/12/21: 62 cm R)    Time 6    Period Weeks    Status Achieved      PT LONG TERM GOAL  #9   TITLE Pt will demo improved gait mechanics with running to minimzie risk for injuries    Time 8    Period Weeks    Status On-going      PT LONG TERM GOAL  #10   TITLE Pt will demo no more tightness in pelvic floor mm across 2 visits with optimal lengthening in order to improve pain and bowel function    Time 6    Period Weeks    Status New    Target Date 07/25/21                   Plan - 08/15/21 1411     Clinical Impression Statement Pt continues to have improvement with lessleakage and smearing. Pt had increased hypomobility at L SIJ today which improved postTx. Pt continues to benefit from skilled PT to progress with glut strengthening, anterior tilt of pelvis, and pelvic stability.    Stability/Clinical Decision Making Evolving/Moderate complexity    Rehab Potential Good    PT Frequency 1x / week    PT Duration Other (comment)   10   PT Treatment/Interventions ADLs/Self Care Home Management;Traction;Moist Heat;Therapeutic activities;Functional mobility training;Therapeutic exercise;Gait training;Neuromuscular re-education;Patient/family education;Manual techniques;Manual lymph drainage;Taping;Energy conservation;Scar mobilization;Balance training;Spinal Manipulations;Dry needling;Cryotherapy    Consulted and Agree with Plan of Care Patient             Patient will benefit  from skilled therapeutic intervention in order to improve the following deficits and impairments:  Abnormal gait, Decreased coordination, Decreased endurance, Difficulty walking, Decreased range of motion, Decreased strength, Decreased scar mobility, Decreased mobility, Increased muscle spasms, Improper body mechanics, Increased  fascial restricitons, Postural dysfunction, Hypomobility, Decreased safety awareness, Decreased balance, Decreased activity tolerance, Impaired flexibility, Pain  Visit Diagnosis: Sacrococcygeal disorders, not elsewhere classified  Fecal smearing  Other abnormalities of gait and mobility  Other lack of coordination     Problem List Patient Active Problem List   Diagnosis Date Noted   Encounter for screening colonoscopy    Hyperlipidemia 01/28/2018   Body mass index (BMI) 19.9 or less, adult 01/28/2018   Dyspareunia in female 01/28/2018   Osteoarthritis of both hands 01/28/2018   Localized osteoporosis without current pathological fracture 09/14/2017   Gastroesophageal reflux disease without esophagitis 09/14/2017    Jerl Mina ,PT, DPT, E-RYT  08/15/2021, 2:13 PM  [place] MAIN [facility] SERVICES [address] [place], [place], [address] Phone: [phone]   Fax:  [phone]  Name: Mary Kirk MRN: [id number] Date of Birth: March 23, 1956

## 2021-08-16 ENCOUNTER — Ambulatory Visit: Payer: BC Managed Care – PPO | Admitting: Gastroenterology

## 2021-08-29 ENCOUNTER — Encounter: Payer: BC Managed Care – PPO | Admitting: Physical Therapy

## 2021-08-31 ENCOUNTER — Ambulatory Visit: Payer: BC Managed Care – PPO | Admitting: Physical Therapy

## 2021-09-12 ENCOUNTER — Other Ambulatory Visit: Payer: Self-pay

## 2021-09-12 ENCOUNTER — Ambulatory Visit: Payer: BC Managed Care – PPO | Attending: Family Medicine | Admitting: Physical Therapy

## 2021-09-12 DIAGNOSIS — R2689 Other abnormalities of gait and mobility: Secondary | ICD-10-CM | POA: Insufficient documentation

## 2021-09-12 DIAGNOSIS — M533 Sacrococcygeal disorders, not elsewhere classified: Secondary | ICD-10-CM | POA: Diagnosis not present

## 2021-09-12 DIAGNOSIS — R278 Other lack of coordination: Secondary | ICD-10-CM | POA: Diagnosis present

## 2021-09-12 DIAGNOSIS — R151 Fecal smearing: Secondary | ICD-10-CM | POA: Diagnosis present

## 2021-09-13 NOTE — Therapy (Signed)
[place] [facility] MAIN [facility] SERVICES [address] [place], [place], [address] Phone: [phone]   Fax:  [phone]  Physical Therapy Treatment  Patient Details  Name: Mary Kirk MRN: [id number] Date of Birth: 1956-07-23 Referring Provider (PT): Nilda Simmer MD   Encounter Date: 09/12/2021   PT End of Session - 09/12/21 1210     Visit Number 18    Date for PT Re-Evaluation 11/21/21   PN 06/12/21, recert 9/28   PT Start Time 1207    PT Stop Time 1300    PT Time Calculation (min) 53 min    Activity Tolerance Patient tolerated treatment well    Behavior During Therapy [facility] for tasks assessed/performed             Past Medical History:  Diagnosis Date   Arthritis    GERD (gastroesophageal reflux disease)    IBS (irritable bowel syndrome)    Constipation predominant   Osteoporosis     Past Surgical History:  Procedure Laterality Date   BUNIONECTOMY Bilateral    COLONOSCOPY WITH PROPOFOL N/A 05/22/2018   Procedure: COLONOSCOPY WITH PROPOFOL;  Surgeon: Midge Minium, MD;  Location: [facility];  Service: Endoscopy;  Laterality: N/A;    There were no vitals filed for this visit.   Subjective Assessment - 09/12/21 1209     Subjective Pt returns after one month. Pt had only one small leakage episode whe she travelled. Pt had no leakage / epsiode across 12 consecutive days.    Pertinent History 1 vaginal deliveries with tearing, Physical fitness routine: in the summer, water skiing, used to run all the time, walk 2 miles 5 times a week. Hx fof osteoporosis, arthritis    Patient Stated Goals be able to go to the toilet and take less Miralax and have less painful intercourse                            Pelvic Floor Special Questions - 09/13/21 0813     Pelvic Floor Internal Exam pt consented verbally for rectal assessment and had no contraindications    Exam Type Rectal    Palpation tightness at 12 oclock, L  sidelying , L ischial rami    Strength fair squeeze, definite lift               [facility] Adult PT Treatment/Exercise - 09/13/21 0811       Therapeutic Activites    Other Therapeutic Activities guided restorative posture to promote thoracic extension to help with anterior tilt of pelvis      Neuro Re-ed    Neuro Re-ed Details  cued for anterior tilt and thoracic spine      Modalities   Modalities Moist Heat      Moist Heat Therapy   Moist Heat Location --   sacrum     Manual Therapy   Internal Pelvic Floor STM/MWM at areas noted in assessment  to promote pelvic floor mobility at perineum and anterior tilt of pelvis                          PT Long Term Goals - 09/12/21 1211       PT LONG TERM GOAL #1   Title Pt will report complete emptying of bowel movements 100% of the time  in order to improve QOL    Time 4    Period Weeks  Status Achieved      PT LONG TERM GOAL #2   Title Pt will complete a food diary    Time 2    Period Weeks    Status Achieved      PT LONG TERM GOAL #3   Title Pt will demo IND with body mechanics to minimzie straining pelvic floor    Time 6    Period Weeks    Status Achieved      PT LONG TERM GOAL #4   Title Pt will demo proper deep core technique and pelvic floor coordination to promote IAP system for postural stability and fecal continence and less pain with intercourse and gynecological exam    Time 5    Period Weeks    Status Achieved      PT LONG TERM GOAL #5   Title Pt will demo increased hip abduction strength to > 4/5 in order to  improve tailbone pain and pelvic alignment to minimize slumped sitting ( 6/28/: 5/5)    Time 8    Period Weeks    Status Achieved      PT LONG TERM GOAL #6   Title Pt will demo proper alignment of coccyx/ SIJ and report no more pain with sitting not pulling pain at rectal area in order to improve QOL    Time 10    Period Weeks    Status Achieved      PT LONG TERM GOAL #7    Title Pt will report Bristol Stool Type 4 100% of the time in order to minimize straining and constipation    Baseline Bristol Stool Type 4 75% of the time, Type 5 - 25% of the time.  ( 09/12/21: Type 3- 20%, Type 5 - 50%, Type 20% , Type 4 -10%    Time 10    Period Weeks    Status On-going      PT LONG TERM GOAL #8   Title Pt will improve hip abd/ER/flex ( figure-4 )AROM with R from lateral tibial plateau 71 cm,  L 63 cm from floor to knee to R to < 65 cm in order to sexual activities and pelvic exam and don/ dof shoes. ( 06/12/21: 62 cm R, 09/12/21:  60 cm L, 63 cm R)    Time 6    Period Weeks    Status Achieved      PT LONG TERM GOAL  #9   TITLE Pt will demo improved gait mechanics with running to minimzie risk for injuries    Time 8    Period Weeks    Status Achieved      PT LONG TERM GOAL  #10   TITLE Pt will demo no more tightness in pelvic floor mm across 2 visits with optimal lengthening in order to improve pain and bowel function    Time 6    Period Weeks    Status Achieved                   Plan - 09/12/21 1211     Clinical Impression Statement Pt has achieved 9/10 goals and continues to have less fecal leakage and smearing. Upon returning to PT after one month, pt reports less episodes across longer duration of time: had only one small leakage episode whe she travelled. Pt had no leakage / epsiode across 12 consecutive days.   Pt required more rectal manual Tx to minimize of anterior mm. Pt also required cues for thoracic extension  to promote more anterior pelvic tilt to lengthen pelvic floor mm. Anticipate will achieve her goals with a few more skilled PT sessions.    Stability/Clinical Decision Making Evolving/Moderate complexity    Rehab Potential Good    PT Frequency 1x / week    PT Duration Other (comment)   10   PT Treatment/Interventions ADLs/Self Care Home Management;Traction;Moist Heat;Therapeutic activities;Functional mobility training;Therapeutic  exercise;Gait training;Neuromuscular re-education;Patient/family education;Manual techniques;Manual lymph drainage;Taping;Energy conservation;Scar mobilization;Balance training;Spinal Manipulations;Dry needling;Cryotherapy    Consulted and Agree with Plan of Care Patient             Patient will benefit from skilled therapeutic intervention in order to improve the following deficits and impairments:  Abnormal gait, Decreased coordination, Decreased endurance, Difficulty walking, Decreased range of motion, Decreased strength, Decreased scar mobility, Decreased mobility, Increased muscle spasms, Improper body mechanics, Increased fascial restricitons, Postural dysfunction, Hypomobility, Decreased safety awareness, Decreased balance, Decreased activity tolerance, Impaired flexibility, Pain  Visit Diagnosis: Sacrococcygeal disorders, not elsewhere classified  Fecal smearing  Other lack of coordination  Other abnormalities of gait and mobility     Problem List Patient Active Problem List   Diagnosis Date Noted   Encounter for screening colonoscopy    Hyperlipidemia 01/28/2018   Body mass index (BMI) 19.9 or less, adult 01/28/2018   Dyspareunia in female 01/28/2018   Osteoarthritis of both hands 01/28/2018   Localized osteoporosis without current pathological fracture 09/14/2017   Gastroesophageal reflux disease without esophagitis 09/14/2017    Mariane Masters, PT 09/13/2021, 10:29 AM  [place] [facility] MAIN [facility] SERVICES [address] [place], [place], [address] Phone: [phone]   Fax:  [phone]  Name: Mary Kirk MRN: [id number] Date of Birth: [birth year]

## 2021-09-13 NOTE — Patient Instructions (Signed)
Restorative posture to promote thoracic extension to help with anterior tilt of pelvis   Towel folded horizontal under armpits, pillow under knees 10 min and then lots of pillows under knees to raise legs 10 min

## 2021-09-27 ENCOUNTER — Ambulatory Visit: Payer: BC Managed Care – PPO | Attending: Family Medicine | Admitting: Physical Therapy

## 2021-09-27 ENCOUNTER — Other Ambulatory Visit: Payer: Self-pay

## 2021-09-27 DIAGNOSIS — M533 Sacrococcygeal disorders, not elsewhere classified: Secondary | ICD-10-CM | POA: Diagnosis not present

## 2021-09-27 DIAGNOSIS — R278 Other lack of coordination: Secondary | ICD-10-CM | POA: Diagnosis present

## 2021-09-27 DIAGNOSIS — R2689 Other abnormalities of gait and mobility: Secondary | ICD-10-CM | POA: Insufficient documentation

## 2021-09-27 DIAGNOSIS — R151 Fecal smearing: Secondary | ICD-10-CM | POA: Diagnosis present

## 2021-09-27 NOTE — Therapy (Signed)
[place] [facility] MAIN [facility] SERVICES [address] [place], [place], [address] Phone: [phone]   Fax:  [phone]  Physical Therapy Treatment / Discharge Summary   Patient Details  Name: Mary Kirk MRN: [id number] Date of Birth: 09/03/1956 Referring Provider (PT): Nilda Simmer MD   Encounter Date: 09/27/2021   PT End of Session - 09/27/21 1419     Visit Number 19    Date for PT Re-Evaluation 11/21/21   PN 06/12/21, recert 9/28   PT Start Time 1400    PT Stop Time 1438    PT Time Calculation (min) 38 min    Activity Tolerance Patient tolerated treatment well    Behavior During Therapy [facility] for tasks assessed/performed             Past Medical History:  Diagnosis Date   Arthritis    GERD (gastroesophageal reflux disease)    IBS (irritable bowel syndrome)    Constipation predominant   Osteoporosis     Past Surgical History:  Procedure Laterality Date   BUNIONECTOMY Bilateral    COLONOSCOPY WITH PROPOFOL N/A 05/22/2018   Procedure: COLONOSCOPY WITH PROPOFOL;  Surgeon: Midge Minium, MD;  Location: [facility];  Service: Endoscopy;  Laterality: N/A;    There were no vitals filed for this visit.   Subjective Assessment - 09/27/21 1405     Subjective Pt had 2 days of smearing but then 10 consecutive days of no smearing. 2 days of leakage after that period.    Pertinent History 1 vaginal deliveries with tearing, Physical fitness routine: in the summer, water skiing, used to run all the time, walk 2 miles 5 times a week. Hx fof osteoporosis, arthritis    Patient Stated Goals be able to go to the toilet and take less Miralax and have less painful intercourse                [facility] PT Assessment - 09/27/21 1600       Observation/Other Assessments   Observations slightly rounded shoulder, upright posture without cues                           [facility] Adult PT Treatment/Exercise - 09/27/21 1525        Therapeutic Activites    Other Therapeutic Activities reassessed goals and admin FOTO , discussed d/c      Neuro Re-ed    Neuro Re-ed Details  cued for sideplank UE position in clams, standing                          PT Long Term Goals - 09/27/21 1415       PT LONG TERM GOAL #1   Title Pt will report complete emptying of bowel movements 100% of the time  in order to improve QOL    Time 4    Period Weeks    Status Achieved      PT LONG TERM GOAL #2   Title Pt will complete a food diary    Time 2    Period Weeks    Status Achieved      PT LONG TERM GOAL #3   Title Pt will demo IND with body mechanics to minimzie straining pelvic floor    Time 6    Period Weeks    Status Achieved      PT LONG TERM GOAL #4   Title Pt  will demo proper deep core technique and pelvic floor coordination to promote IAP system for postural stability and fecal continence and less pain with intercourse and gynecological exam    Time 5    Period Weeks    Status Achieved      PT LONG TERM GOAL #5   Title Pt will demo increased hip abduction strength to > 4/5 in order to  improve tailbone pain and pelvic alignment to minimize slumped sitting ( 6/28/: 5/5)    Time 8    Period Weeks    Status Achieved      PT LONG TERM GOAL #6   Title Pt will demo proper alignment of coccyx/ SIJ and report no more pain with sitting not pulling pain at rectal area in order to improve QOL    Time 10    Period Weeks    Status Achieved      PT LONG TERM GOAL #7   Title Pt will report Bristol Stool Type 4 100% of the time in order to minimize straining and constipation    Baseline Bristol Stool Type 4 75% of the time, Type 5 - 25% of the time.  ( 09/12/21: Type 3- 20%, Type 5 - 50%, Type 20% , Type 4 -10%) ( 09/27/21: Type 4 - 85%-90%)    Time 10    Period Weeks    Status Achieved      PT LONG TERM GOAL #8   Title Pt will improve hip abd/ER/flex ( figure-4 )AROM with R from lateral tibial plateau 71  cm,  L 63 cm from floor to knee to R to < 65 cm in order to sexual activities and pelvic exam and don/ dof shoes. ( 06/12/21: 62 cm R, 09/12/21:  60 cm L, 63 cm R)    Time 6    Period Weeks    Status Achieved      PT LONG TERM GOAL  #9   TITLE Pt will demo improved gait mechanics with running to minimzie risk for injuries    Time 8    Period Weeks    Status Achieved      PT LONG TERM GOAL  #10   TITLE Pt will demo no more tightness in pelvic floor mm across 2 visits with optimal lengthening in order to improve pain and bowel function    Time 6    Period Weeks    Status Achieved                   Plan - 09/27/21 1420     Clinical Impression Statement Pt has achieved 100% of her goals.  Improvements include:  _across the past 2 months, pt experienced 2 periods of 10-12 consecutive days without leakage/ smearing. _travelled to out of town trips, eat out, and have less bowel issues _returning to walking with less leakage and smearing _FOTO score improved indicating improved function: Pain 8pt -> 4 pts Constipation 49pts --> 55 pts Leakage 44 pts --> 57 pts _pt now has Type 4 stool consistency from 10% of the time to 85-90% of the time)    MSK Improvements include:  _levelled pelvis , spine, coccyx  with no more misalignment  _stronger deep core mm ( IAP system) _improved posture, no more slumped, sacral sitting, forward head  positioning _stronger hip strngth, increased hip mobility   Pt is ready for d/c at this time and is IND with HEP.       Stability/Clinical Decision Making  Evolving/Moderate complexity    Rehab Potential Good    PT Frequency 1x / week    PT Duration Other (comment)   10   PT Treatment/Interventions ADLs/Self Care Home Management;Traction;Moist Heat;Therapeutic activities;Functional mobility training;Therapeutic exercise;Gait training;Neuromuscular re-education;Patient/family education;Manual techniques;Manual lymph drainage;Taping;Energy  conservation;Scar mobilization;Balance training;Spinal Manipulations;Dry needling;Cryotherapy    Consulted and Agree with Plan of Care Patient             Patient will benefit from skilled therapeutic intervention in order to improve the following deficits and impairments:  Abnormal gait, Decreased coordination, Decreased endurance, Difficulty walking, Decreased range of motion, Decreased strength, Decreased scar mobility, Decreased mobility, Increased muscle spasms, Improper body mechanics, Increased fascial restricitons, Postural dysfunction, Hypomobility, Decreased safety awareness, Decreased balance, Decreased activity tolerance, Impaired flexibility, Pain  Visit Diagnosis: Sacrococcygeal disorders, not elsewhere classified  Fecal smearing  Other lack of coordination  Other abnormalities of gait and mobility     Problem List Patient Active Problem List   Diagnosis Date Noted   Encounter for screening colonoscopy    Hyperlipidemia 01/28/2018   Body mass index (BMI) 19.9 or less, adult 01/28/2018   Dyspareunia in female 01/28/2018   Osteoarthritis of both hands 01/28/2018   Localized osteoporosis without current pathological fracture 09/14/2017   Gastroesophageal reflux disease without esophagitis 09/14/2017    Mariane Masters, PT 09/27/2021, 4:02 PM  [place] [facility] MAIN [facility] SERVICES [address] [place], [place], [address] Phone: [phone]   Fax:  [phone]  Name: Hilliary Jock MRN: [id number] Date of Birth: [birth year]

## 2021-10-10 ENCOUNTER — Encounter: Payer: BC Managed Care – PPO | Admitting: Physical Therapy

## 2021-10-24 ENCOUNTER — Encounter: Payer: BC Managed Care – PPO | Admitting: Physical Therapy

## 2022-02-14 ENCOUNTER — Other Ambulatory Visit: Payer: Self-pay | Admitting: Family Medicine

## 2022-02-14 DIAGNOSIS — Z1231 Encounter for screening mammogram for malignant neoplasm of breast: Secondary | ICD-10-CM

## 2022-02-21 ENCOUNTER — Other Ambulatory Visit: Payer: Self-pay

## 2022-02-21 ENCOUNTER — Ambulatory Visit
Admission: RE | Admit: 2022-02-21 | Discharge: 2022-02-21 | Disposition: A | Discharge status: Home / Self Care | Payer: BC Managed Care – PPO | Source: Ambulatory Visit | Attending: Family Medicine | Admitting: Family Medicine

## 2022-02-21 DIAGNOSIS — Z1231 Encounter for screening mammogram for malignant neoplasm of breast: Secondary | ICD-10-CM | POA: Diagnosis present

## 2022-04-18 IMAGING — MG MM DIGITAL SCREENING BILAT W/ TOMO AND CAD
8 series · 9 of 24 positions shown · non-contrast
Comparison: Previous exam(s).

CLINICAL DATA: Screening.

EXAM:
DIGITAL SCREENING BILATERAL MAMMOGRAM WITH TOMOSYNTHESIS AND CAD
TECHNIQUE: Bilateral screening digital craniocaudal and mediolateral oblique
mammograms were obtained. Bilateral screening digital breast
tomosynthesis was performed. The images were evaluated with
computer-aided detection.

[L CC synth-2D]
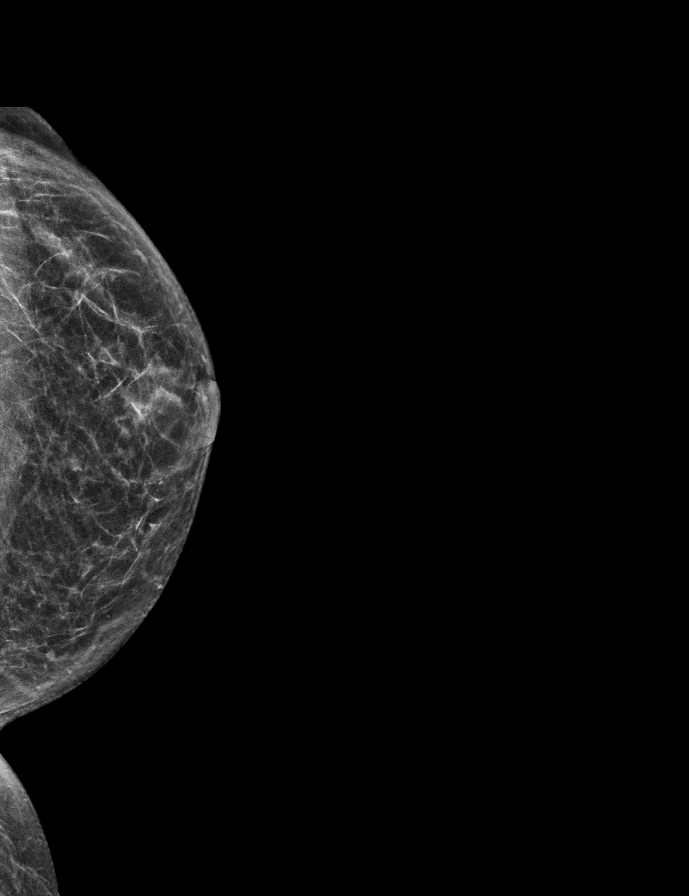

[R MLO synth-2D]
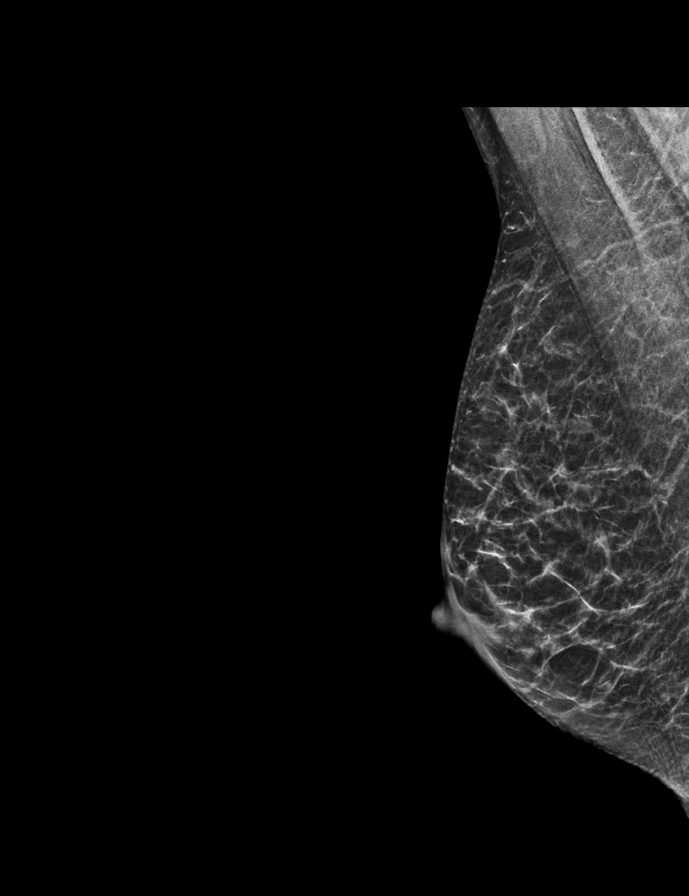

[L MLO synth-2D]
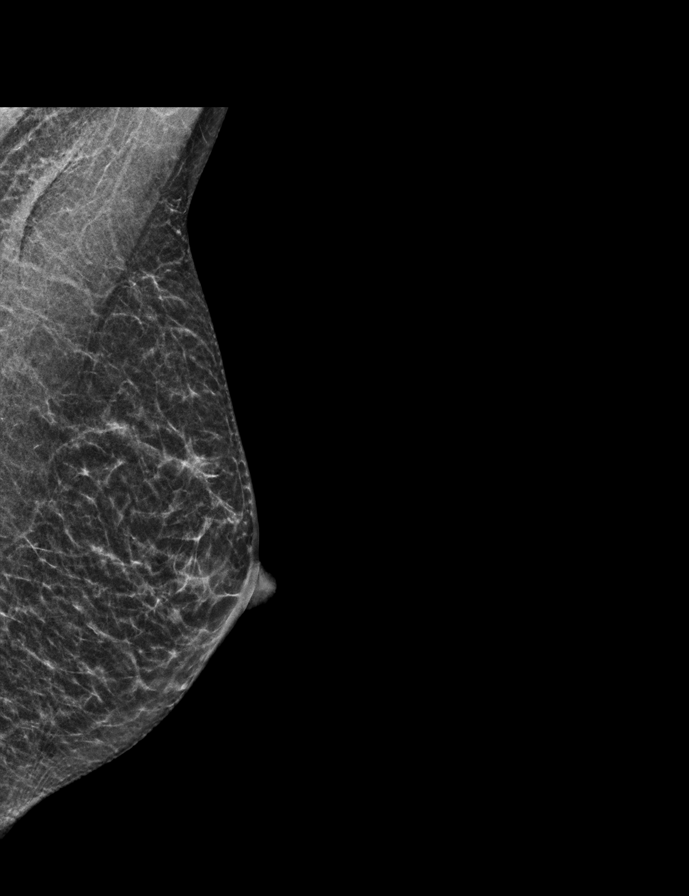

[R CC synth-2D]
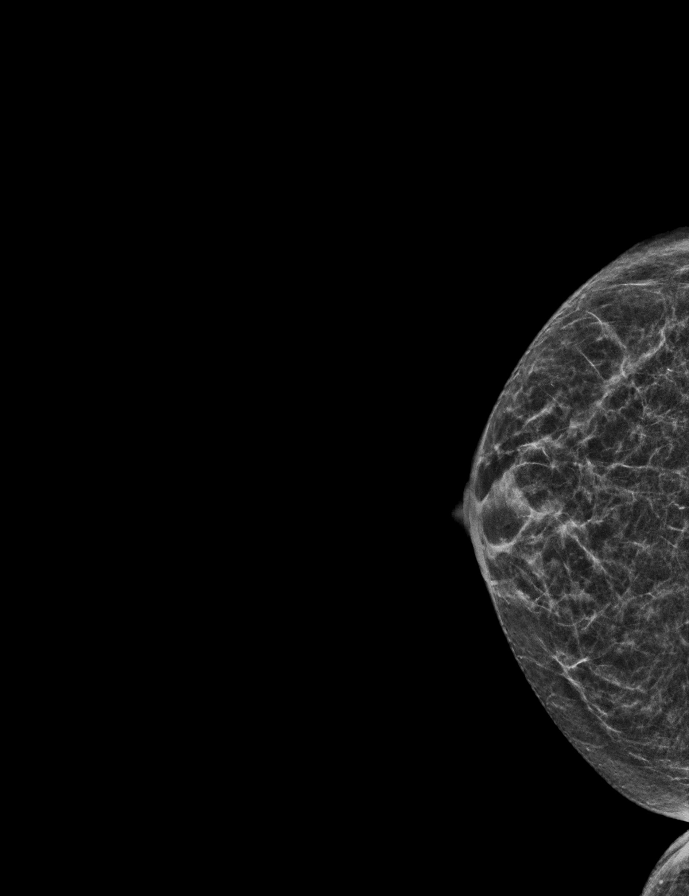

[R MLO tomo · 2 of 40 frames shown]
[frame 13/40]
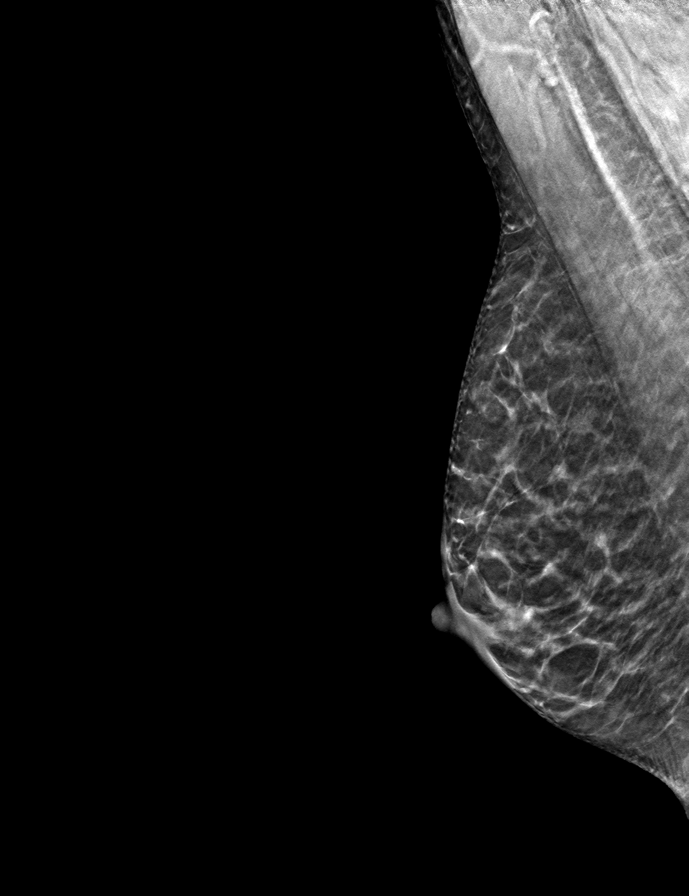
[frame 21/40]
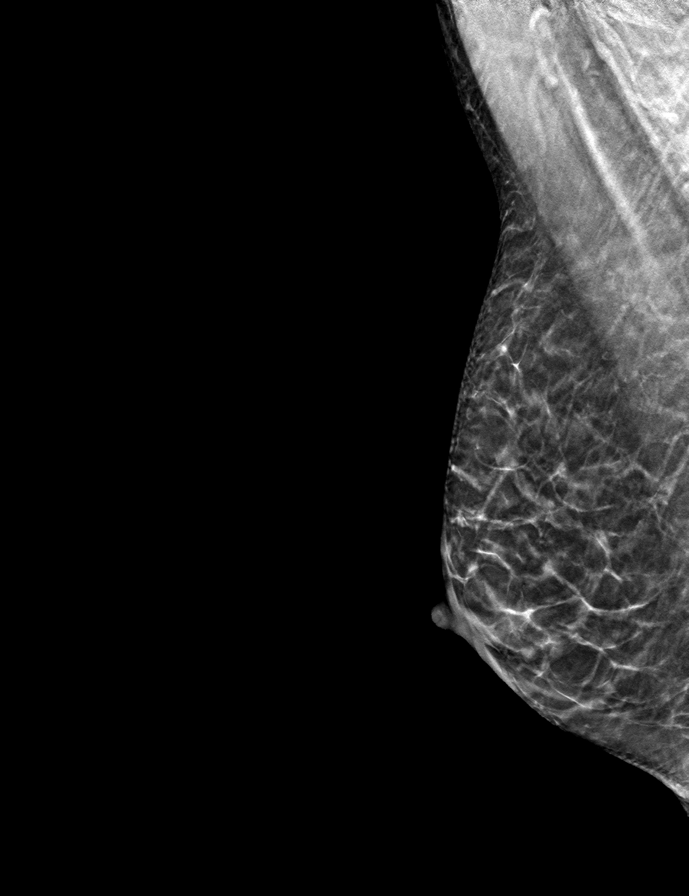

[L MLO tomo · tomo slice 19/37.0]
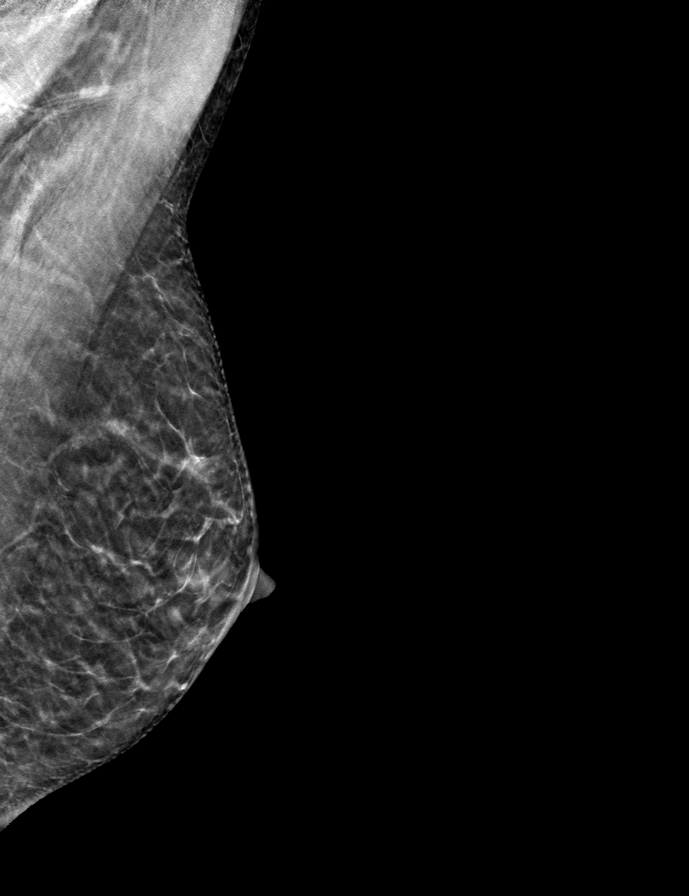

[L CC tomo · tomo slice 23/44.0]
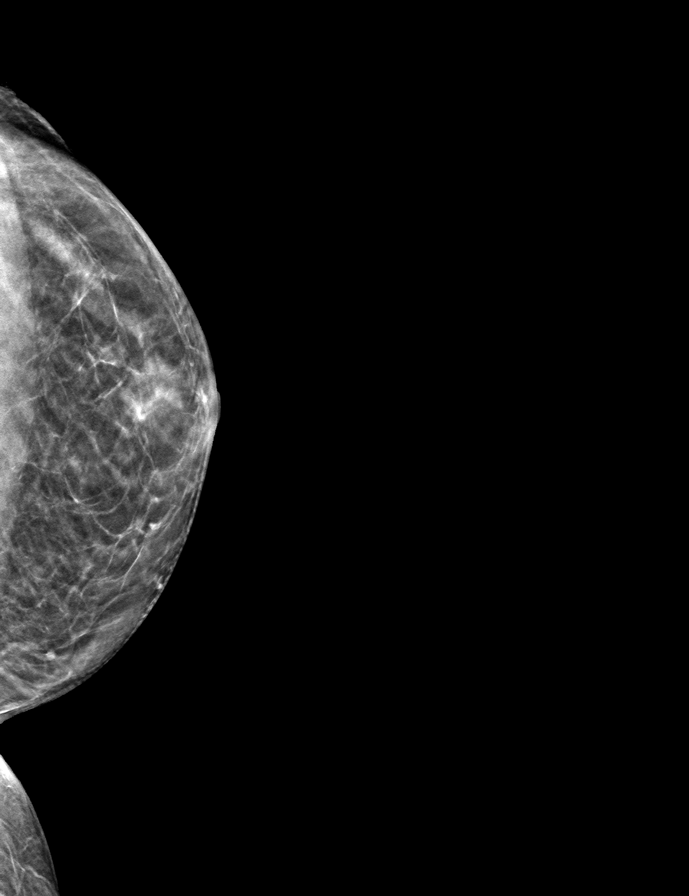

[R CC tomo · tomo slice 21/40.0]
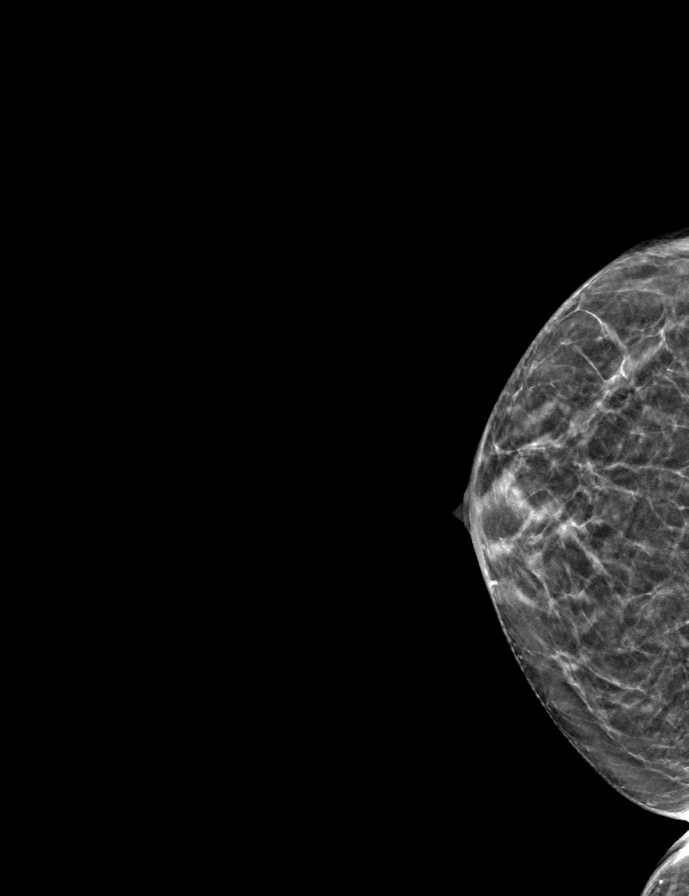

[9 of 24 positions shown; findings below may reference images not displayed]

ACR Breast Density Category b: There are scattered areas of
fibroglandular density.
FINDINGS: There are no findings suspicious for malignancy.
IMPRESSION: No mammographic evidence of malignancy. A result letter of this
screening mammogram will be mailed directly to the patient.

RECOMMENDATION:
Screening mammogram in one year. (Code:51-O-LD2)

BI-RADS CATEGORY  1: Negative.

## 2022-07-10 ENCOUNTER — Emergency Department
Admission: EM | Admit: 2022-07-10 | Discharge: 2022-07-10 | Disposition: A | Discharge status: Home / Self Care | Payer: BC Managed Care – PPO | Attending: Emergency Medicine | Admitting: Emergency Medicine

## 2022-07-10 ENCOUNTER — Other Ambulatory Visit: Payer: Self-pay

## 2022-07-10 ENCOUNTER — Encounter: Payer: Self-pay | Admitting: Emergency Medicine

## 2022-07-10 ENCOUNTER — Emergency Department: Payer: BC Managed Care – PPO

## 2022-07-10 DIAGNOSIS — W19XXXA Unspecified fall, initial encounter: Secondary | ICD-10-CM

## 2022-07-10 DIAGNOSIS — Y9373 Activity, racquet and hand sports: Secondary | ICD-10-CM | POA: Diagnosis not present

## 2022-07-10 DIAGNOSIS — S299XXA Unspecified injury of thorax, initial encounter: Secondary | ICD-10-CM | POA: Diagnosis present

## 2022-07-10 DIAGNOSIS — W1839XA Other fall on same level, initial encounter: Secondary | ICD-10-CM | POA: Insufficient documentation

## 2022-07-10 DIAGNOSIS — S20211A Contusion of right front wall of thorax, initial encounter: Secondary | ICD-10-CM | POA: Insufficient documentation

## 2022-07-10 MED ORDER — TRAMADOL HCL 50 MG PO TABS: 50.0000 mg | ORAL_TABLET | Freq: Four times a day (QID) | ORAL | 0 refills | Status: DC | PRN

## 2022-07-10 NOTE — Discharge Instructions (Signed)
Please use your incentive spirometer 2-3 times per hour while awake for the next 7 days.  Return to the emergency department for any significant worsening of chest pain, fever increased cough or any other symptom personally concerning to yourself.  Otherwise please follow-up with your primary care for recheck/reevaluation.

## 2022-07-10 NOTE — ED Triage Notes (Signed)
Pt via POV from home. Pt c/o R sided rib pain after mechanical fall playing pickleball. Denies head injury. Denies LOC. Pt is A&Ox4 and NAD

## 2022-07-10 NOTE — ED Provider Notes (Signed)
   [facility] Provider Note    Event Date/Time   First MD Initiated Contact with Patient 07/10/22 1422     (approximate)  History   Chief Complaint: Fall  HPI  Mary Kirk is a 66 y.o. female with a past medical history of gastric reflux presents to the emergency department for right chest pain after a fall.  According to the patient she was playing pickle ball when she stumbled and fell into the net and the pole that holds the net up caught her in the right chest.  Patient states pain to the right lateral mid chest worse with deep inspiration.  No shortness of breath or trouble breathing.  Physical Exam   Triage Vital Signs: ED Triage Vitals  Enc Vitals Group     BP 07/10/22 1252 125/79     Pulse Rate 07/10/22 1252 72     Resp 07/10/22 1252 14     Temp 07/10/22 1252 98.4 F (36.9 C)     Temp Source 07/10/22 1252 Oral     SpO2 07/10/22 1252 100 %     Weight 07/10/22 1245 105 lb (47.6 kg)     Height 07/10/22 1245 5\' 10"  (1.778 m)     Head Circumference --      Peak Flow --      Pain Score 07/10/22 1245 5     Pain Loc --      Pain Edu? --      Excl. in GC? --     Most recent vital signs: Vitals:   07/10/22 1252  BP: 125/79  Pulse: 72  Resp: 14  Temp: 98.4 F (36.9 C)  SpO2: 100%    General: Awake, no distress.  CV:  Good peripheral perfusion.  Regular rate and rhythm  Resp:  Normal effort.  Equal breath sounds bilaterally.  Moderate right mid lateral chest wall tenderness to palpation. Abd:  No distention.  Soft, nontender.  No rebound or guarding.  Benign abdomen.   ED Results / Procedures / Treatments   RADIOLOGY  I have reviewed and interpreted the CT scan of the chest there is no pneumothorax seen on my evaluation.   MEDICATIONS ORDERED IN ED: Medications - No data to display   IMPRESSION / MDM / ASSESSMENT AND PLAN / ED COURSE  I reviewed the triage vital signs and the nursing notes.  Patient's presentation is most  consistent with acute presentation with potential threat to life or bodily function.  Patient presents to the emergency department for right chest wall pain after a fall while playing pickle ball.  Patient does have moderate tenderness to palpation of the right chest tenderness to compression as well.  We will obtain CT scan without contrast to evaluate for any rib fracture versus contusion ensure there is no pneumothorax.  Patient agreeable to plan of care.  CT scan of the chest is negative for rib fracture.  Negative for pneumothorax.  We will discharge with incentive spirometer and a prescription for tramadol to be used on top of Tylenol if needed.  Patient agreeable to plan of care.  Discussed return precautions as well as PCP follow-up.  FINAL CLINICAL IMPRESSION(S) / ED DIAGNOSES   Chest wall pain Rib contusion  Note:  This document was prepared using Dragon voice recognition software and may include unintentional dictation errors.   07/12/22, MD 07/10/22 1529

## 2023-03-19 ENCOUNTER — Other Ambulatory Visit: Payer: Self-pay | Admitting: Family Medicine

## 2023-03-19 DIAGNOSIS — Z1231 Encounter for screening mammogram for malignant neoplasm of breast: Secondary | ICD-10-CM

## 2023-03-27 ENCOUNTER — Other Ambulatory Visit: Payer: Self-pay | Admitting: Family Medicine

## 2023-03-27 ENCOUNTER — Telehealth: Payer: Self-pay

## 2023-03-27 ENCOUNTER — Other Ambulatory Visit: Payer: Self-pay

## 2023-03-27 DIAGNOSIS — E2839 Other primary ovarian failure: Secondary | ICD-10-CM

## 2023-03-27 DIAGNOSIS — Z800 Family history of malignant neoplasm of digestive organs: Secondary | ICD-10-CM

## 2023-03-27 DIAGNOSIS — Z1211 Encounter for screening for malignant neoplasm of colon: Secondary | ICD-10-CM

## 2023-03-27 DIAGNOSIS — Z1231 Encounter for screening mammogram for malignant neoplasm of breast: Secondary | ICD-10-CM

## 2023-03-27 MED ORDER — NA SULFATE-K SULFATE-MG SULF 17.5-3.13-1.6 GM/177ML PO SOLN: 1.0000 | Freq: Once | ORAL | 0 refills | Status: AC

## 2023-03-27 NOTE — Telephone Encounter (Signed)
Gastroenterology Pre-Procedure Review  Request Date: 05/29/23 Requesting Physician: Dr. Servando Snare  PATIENT REVIEW QUESTIONS: The patient responded to the following health history questions as indicated:    1. Are you having any GI issues? Patient stated that she has chronic constipation however she has had this for years and takes Miralax daily which helps her to stay regular 2. Do you have a personal history of Polyps? no 3. Do you have a family history of Colon Cancer or Polyps? yes (sister colon cancer) 4. Diabetes Mellitus? no 5. Joint replacements in the past 12 months?no 6. Major health problems in the past 3 months?no 7. Any artificial heart valves, MVP, or defibrillator?no    MEDICATIONS & ALLERGIES:    Patient reports the following regarding taking any anticoagulation/antiplatelet therapy:   Plavix, Coumadin, Eliquis, Xarelto, Lovenox, Pradaxa, Brilinta, or Effient? no Aspirin? no  Patient confirms/reports the following medications:  Current Outpatient Medications  Medication Sig Dispense Refill   acetaminophen (TYLENOL) 325 MG tablet Take 650 mg by mouth every 6 (six) hours as needed.     alendronate (FOSAMAX) 70 MG tablet Take 1 tablet (70 mg total) by mouth once a week. (Patient not taking: Reported on 04/03/2021) 12 tablet 3   aspirin 81 MG chewable tablet Chew by mouth daily. (Patient not taking: Reported on 04/03/2021)     Calcium Carb-Cholecalciferol (CALCIUM 600 + D PO) Take 600 mg by mouth 2 (two) times daily.     estradiol (ESTRACE) 0.1 MG/GM vaginal cream Place vaginally.     ibandronate (BONIVA) 150 MG tablet Take 150 mg by mouth every 30 (thirty) days. Take in the morning with a full glass of water, on an empty stomach, and do not take anything else by mouth or lie down for the next 30 min.     pantoprazole (PROTONIX) 40 MG tablet Take 40 mg by mouth daily.     ranitidine (ZANTAC) 150 MG tablet Take 150 mg by mouth 2 (two) times daily.     traMADol (ULTRAM) 50 MG tablet  Take 1 tablet (50 mg total) by mouth every 6 (six) hours as needed. 15 tablet 0   No current facility-administered medications for this visit.    Patient confirms/reports the following allergies:  Allergies  Allergen Reactions   Codeine Nausea Only    No orders of the defined types were placed in this encounter.   AUTHORIZATION INFORMATION Primary Insurance: 1D#: Group #:  Secondary Insurance: 1D#: Group #:  SCHEDULE INFORMATION: Date: 05/29/23 Time: Location: [facility]

## 2023-05-22 ENCOUNTER — Encounter: Payer: Self-pay | Admitting: Gastroenterology

## 2023-05-23 NOTE — Anesthesia Preprocedure Evaluation (Signed)
Anesthesia Evaluation  Patient identified by MRN, date of birth, ID band Patient awake    Reviewed: Allergy & Precautions, H&P , NPO status , Patient's Chart, lab work & pertinent test results  History of Anesthesia Complications (+) PONV and history of anesthetic complications  Airway Mallampati: III  TM Distance: <3 FB Neck ROM: Full    Dental no notable dental hx.    Pulmonary neg pulmonary ROS   Pulmonary exam normal breath sounds clear to auscultation       Cardiovascular negative cardio ROS Normal cardiovascular exam Rhythm:Regular Rate:Normal     Neuro/Psych   Anxiety     negative neurological ROS  negative psych ROS   GI/Hepatic negative GI ROS, Neg liver ROS,GERD  ,,  Endo/Other  negative endocrine ROS    Renal/GU negative Renal ROS  negative genitourinary   Musculoskeletal negative musculoskeletal ROS (+) Arthritis ,    Abdominal   Peds negative pediatric ROS (+)  Hematology negative hematology ROS (+)   Anesthesia Other Findings IBS (irritable bowel syndrome)  Generalized Anxiety disorder Arthritis Osteoporosis  GERD (gastroesophageal reflux disease) PONV (postoperative nausea and vomiting)     Reproductive/Obstetrics negative OB ROS                             Anesthesia Physical Anesthesia Plan  ASA: 2  Anesthesia Plan: General   Post-op Pain Management:    Induction: Intravenous  PONV Risk Score and Plan:   Airway Management Planned: Natural Airway and Nasal Cannula  Additional Equipment:   Intra-op Plan:   Post-operative Plan:   Informed Consent: I have reviewed the patients History and Physical, chart, labs and discussed the procedure including the risks, benefits and alternatives for the proposed anesthesia with the patient or authorized representative who has indicated his/her understanding and acceptance.     Dental Advisory Given  Plan  Discussed with: Anesthesiologist, CRNA and Surgeon  Anesthesia Plan Comments: (Patient consented for risks of anesthesia including but not limited to:  - adverse reactions to medications - risk of airway placement if required - damage to eyes, teeth, lips or other oral mucosa - nerve damage due to positioning  - sore throat or hoarseness - Damage to heart, brain, nerves, lungs, other parts of body or loss of life  Patient voiced understanding.)       Anesthesia Quick Evaluation

## 2023-05-29 ENCOUNTER — Other Ambulatory Visit: Payer: Self-pay

## 2023-05-29 ENCOUNTER — Encounter: Payer: Self-pay | Admitting: Gastroenterology

## 2023-05-29 ENCOUNTER — Ambulatory Visit: Payer: BC Managed Care – PPO | Admitting: Anesthesiology

## 2023-05-29 ENCOUNTER — Ambulatory Visit
Admission: RE | Admit: 2023-05-29 | Discharge: 2023-05-29 | Disposition: A | Discharge status: Home / Self Care | Payer: BC Managed Care – PPO | Attending: Gastroenterology | Admitting: Gastroenterology

## 2023-05-29 ENCOUNTER — Encounter
Admission: RE | Disposition: A | Discharge status: Home / Self Care | Payer: Self-pay | Source: Home / Self Care | Attending: Gastroenterology

## 2023-05-29 DIAGNOSIS — K635 Polyp of colon: Secondary | ICD-10-CM

## 2023-05-29 DIAGNOSIS — Z800 Family history of malignant neoplasm of digestive organs: Secondary | ICD-10-CM | POA: Diagnosis not present

## 2023-05-29 DIAGNOSIS — K5730 Diverticulosis of large intestine without perforation or abscess without bleeding: Secondary | ICD-10-CM | POA: Diagnosis not present

## 2023-05-29 DIAGNOSIS — Z1211 Encounter for screening for malignant neoplasm of colon: Secondary | ICD-10-CM | POA: Insufficient documentation

## 2023-05-29 DIAGNOSIS — K640 First degree hemorrhoids: Secondary | ICD-10-CM | POA: Insufficient documentation

## 2023-05-29 HISTORY — PX: POLYPECTOMY: SHX5525

## 2023-05-29 HISTORY — PX: COLONOSCOPY WITH PROPOFOL: SHX5780

## 2023-05-29 HISTORY — DX: Anxiety disorder, unspecified: F41.9

## 2023-05-29 HISTORY — DX: Other specified postprocedural states: Z98.890

## 2023-05-29 HISTORY — DX: Other specified postprocedural states: R11.2

## 2023-05-29 SURGERY — COLONOSCOPY WITH PROPOFOL
Anesthesia: General | Site: Rectum

## 2023-05-29 MED ORDER — ONDANSETRON HCL 4 MG/2ML IJ SOLN
INTRAMUSCULAR | Status: DC | PRN
  Administered 2023-05-29: 4 mg via INTRAVENOUS

## 2023-05-29 MED ORDER — SODIUM CHLORIDE 0.9 % IV SOLN: INTRAVENOUS | Status: DC

## 2023-05-29 MED ORDER — LIDOCAINE HCL (CARDIAC) PF 100 MG/5ML IV SOSY
PREFILLED_SYRINGE | INTRAVENOUS | Status: DC | PRN
  Administered 2023-05-29: 50 mg via INTRAVENOUS

## 2023-05-29 MED ORDER — LACTATED RINGERS IV SOLN: INTRAVENOUS | Status: DC

## 2023-05-29 MED ORDER — PROPOFOL 10 MG/ML IV BOLUS
INTRAVENOUS | Status: DC | PRN
  Administered 2023-05-29: 30 mg via INTRAVENOUS
  Administered 2023-05-29: 90 mg via INTRAVENOUS
  Administered 2023-05-29 (×3): 20 mg via INTRAVENOUS

## 2023-05-29 MED ORDER — STERILE WATER FOR IRRIGATION IR SOLN
Status: DC | PRN
  Administered 2023-05-29: 50 mL

## 2023-05-29 SURGICAL SUPPLY — 8 items
GOWN CVR UNV OPN BCK APRN NK (MISCELLANEOUS) ×4 IMPLANT
GOWN ISOL THUMB LOOP REG UNIV (MISCELLANEOUS) ×4
KIT PRC NS LF DISP ENDO (KITS) ×2 IMPLANT
KIT PROCEDURE OLYMPUS (KITS) ×2
MANIFOLD NEPTUNE II (INSTRUMENTS) ×2 IMPLANT
SNARE COLD EXACTO (MISCELLANEOUS) IMPLANT
TRAP ETRAP POLY (MISCELLANEOUS) IMPLANT
WATER STERILE IRR 250ML POUR (IV SOLUTION) ×2 IMPLANT

## 2023-05-29 NOTE — Transfer of Care (Signed)
Immediate Anesthesia Transfer of Care Note  Patient: Mary Kirk  Procedure(s) Performed: COLONOSCOPY WITH PROPOFOL (Rectum) POLYPECTOMY (Rectum)  Patient Location: PACU  Anesthesia Type: General  Level of Consciousness: awake, alert  and patient cooperative  Airway and Oxygen Therapy: Patient Spontanous Breathing and Patient connected to supplemental oxygen  Post-op Assessment: Post-op Vital signs reviewed, Patient's Cardiovascular Status Stable, Respiratory Function Stable, Patent Airway and No signs of Nausea or vomiting  Post-op Vital Signs: Reviewed and stable  Complications: No notable events documented.

## 2023-05-29 NOTE — H&P (Signed)
Midge Minium, MD [facility] [address] [place], [place] [address] Phone:[phone] Fax : [phone]  Primary Care Physician:  Ethelda Chick, MD Primary Gastroenterologist:  Dr. Servando Snare  Pre-Procedure History & Physical: HPI:  Mary Kirk is a 67 y.o. female is here for an colonoscopy.   Past Medical History:  Diagnosis Date   Anxiety    Arthritis    GERD (gastroesophageal reflux disease)    IBS (irritable bowel syndrome)    Constipation predominant   Osteoporosis    PONV (postoperative nausea and vomiting)     Past Surgical History:  Procedure Laterality Date   BUNIONECTOMY Bilateral    COLONOSCOPY WITH PROPOFOL N/A 05/22/2018   Procedure: COLONOSCOPY WITH PROPOFOL;  Surgeon: Midge Minium, MD;  Location: [facility];  Service: Endoscopy;  Laterality: N/A;    Prior to Admission medications   Medication Sig Start Date End Date Taking? Authorizing Provider  acetaminophen (TYLENOL) 325 MG tablet Take 650 mg by mouth every 6 (six) hours as needed.   Yes [provider]  aspirin 81 MG chewable tablet Chew by mouth daily.   Yes [provider]  Calcium Carb-Cholecalciferol (CALCIUM 600 + D PO) Take 600 mg by mouth 2 (two) times daily.   Yes [provider]  escitalopram (LEXAPRO) 10 MG tablet Take 10 mg by mouth daily.   Yes [provider]  estradiol (ESTRACE) 0.1 MG/GM vaginal cream Place vaginally. 02/19/17  Yes [provider]  ibandronate (BONIVA) 150 MG tablet Take 150 mg by mouth every 30 (thirty) days. Take in the morning with a full glass of water, on an empty stomach, and do not take anything else by mouth or lie down for the next 30 min.   Yes [provider]  pantoprazole (PROTONIX) 40 MG tablet Take 40 mg by mouth daily.   Yes [provider]  polyethylene glycol (MIRALAX / GLYCOLAX) 17 g packet Take 17 g by mouth daily.   Yes [provider]  polyethylene glycol powder  (GLYCOLAX/MIRALAX) 17 GM/SCOOP powder Take 1 Container by mouth once.   Yes [provider]  alendronate (FOSAMAX) 70 MG tablet Take 1 tablet (70 mg total) by mouth once a week. Patient not taking: Reported on 04/03/2021 01/30/18   Porfirio Oar, PA  ranitidine (ZANTAC) 150 MG tablet Take 150 mg by mouth 2 (two) times daily. Patient not taking: Reported on 05/22/2023    [provider]  traMADol (ULTRAM) 50 MG tablet Take 1 tablet (50 mg total) by mouth every 6 (six) hours as needed. Patient not taking: Reported on 05/22/2023 07/10/22   Minna Antis, MD    Allergies as of 03/27/2023 - Review Complete 07/10/2022  Allergen Reaction Noted   Codeine Nausea Only 07/20/2014    Family History  Problem Relation Age of Onset   Diabetes Mother    Heart disease Mother    Arthritis Father    Hyperlipidemia Father    Hypertension Father    Stroke Father 37       CVA at age 17; recurrent CVA age 68.   Dementia Father    Breast cancer Maternal Grandmother 48    Social History   Socioeconomic History   Marital status: Married    Spouse name: Not on file   Number of children: 1   Years of education: Not on file   Highest education level: Not on file  Occupational History   Occupation: unemployed    Employer: [employer]  Comment: previous bank work  Tobacco Use   Smoking status: Never   Smokeless tobacco: Never  Substance and Sexual Activity   Alcohol use: Yes    Alcohol/week: 10.0 standard drinks of alcohol    Types: 10 Glasses of wine per week    Comment: wine   Drug use: No   Sexual activity: Yes  Other Topics Concern   Not on file  Social History Narrative   Marital status: married x 42 years.     Children: 1 son; 2 grandchildren      Lives: with husband      Employment: unemployed; previous bank work.      Tobacco; never      Alcohol: 1-2 glasses of wine per night      Exercise: jogging and walking in 2018      ADLs: independent    Social Determinants of Health   Financial Resource Strain: Not on file  Food Insecurity: Not on file  Transportation Needs: Not on file  Physical Activity: Sufficiently Active (02/11/2018)   Exercise Vital Sign    Days of Exercise per Week: 4 days    Minutes of Exercise per Session: 150+ min  Stress: Not on file  Social Connections: Not on file  Intimate Partner Violence: Not on file    Review of Systems: See HPI, otherwise negative ROS  Physical Exam: BP 139/81   Pulse 65   Temp 99 F (37.2 C) (Temporal)   Resp 18   Ht 5\' 4"  (1.626 m)   Wt 49 kg   SpO2 98%   BMI 18.54 kg/m  General:   Alert,  pleasant and cooperative in NAD Head:  Normocephalic and atraumatic. Neck:  Supple; no masses or thyromegaly. Lungs:  Clear throughout to auscultation.    Heart:  Regular rate and rhythm. Abdomen:  Soft, nontender and nondistended. Normal bowel sounds, without guarding, and without rebound.   Neurologic:  Alert and  oriented x4;  grossly normal neurologically.  Impression/Plan: Mary Kirk is here for an colonoscopy to be performed for family fmaily hisotry with a sister with colon cancer  Risks, benefits, limitations, and alternatives regarding  colonoscopy have been reviewed with the patient.  Questions have been answered.  All parties agreeable.   Midge Minium, MD  05/29/2023, 7:56 AM

## 2023-05-29 NOTE — Op Note (Signed)
[facility] Patient Name: Mary Kirk Procedure Date: 05/29/2023 7:55 AM MRN: [id number] Account #: [id number] Date of Birth: 1956-06-14 Admit Type: Outpatient Age: 67 Room: [facility] OR ROOM 01 Gender: Female Note Status: Finalized Instrument Name: [id number] Procedure:             Colonoscopy Indications:           Screening in patient at increased risk: Family history                         of 1st-degree relative with colorectal cancer Providers:             Midge Minium MD, MD Referring MD:          Myrle Sheng. Katrinka Blazing, MD (Referring MD) Medicines:             Propofol per Anesthesia Complications:         No immediate complications. Procedure:             Pre-Anesthesia Assessment:                        - Prior to the procedure, a History and Physical was                         performed, and patient medications and allergies were                         reviewed. The patient's tolerance of previous                         anesthesia was also reviewed. The risks and benefits                         of the procedure and the sedation options and risks                         were discussed with the patient. All questions were                         answered, and informed consent was obtained. Prior                         Anticoagulants: The patient has taken no anticoagulant                         or antiplatelet agents. ASA Grade Assessment: II - A                         patient with mild systemic disease. After reviewing                         the risks and benefits, the patient was deemed in                         satisfactory condition to undergo the procedure.                        After obtaining informed consent, the colonoscope was  passed under direct vision. Throughout the procedure,                         the patient's blood pressure, pulse, and oxygen                         saturations were monitored  continuously. The was                         introduced through the anus and advanced to the the                         cecum, identified by appendiceal orifice and ileocecal                         valve. The colonoscopy was performed without                         difficulty. The patient tolerated the procedure well.                         The quality of the bowel preparation was excellent. Findings:      The perianal and digital rectal examinations were normal.      A 6 mm polyp was found in the cecum. The polyp was sessile. The polyp       was removed with a cold snare. Resection and retrieval were complete.      Multiple small-mouthed diverticula were found in the sigmoid colon.      Non-bleeding internal hemorrhoids were found during retroflexion. The       hemorrhoids were Grade I (internal hemorrhoids that do not prolapse). Impression:            - One 6 mm polyp in the cecum, removed with a cold                         snare. Resected and retrieved.                        - Diverticulosis in the sigmoid colon.                        - Non-bleeding internal hemorrhoids. Recommendation:        - Discharge patient to home.                        - Resume previous diet.                        - Continue present medications.                        - Await pathology results.                        - Repeat colonoscopy in 5 years for surveillance. Procedure Code(s):     --- Professional ---                        [phone], Colonoscopy, flexible; with removal of  tumor(s), polyp(s), or other lesion(s) by snare                         technique Diagnosis Code(s):     --- Professional ---                        Z80.0, Family history of malignant neoplasm of                         digestive organs                        D12.0, Benign neoplasm of cecum CPT copyright 2022 American Medical Association. All rights reserved. The codes documented in this report are  preliminary and upon coder review may  be revised to meet current compliance requirements. Midge Minium MD, MD 05/29/2023 8:23:52 AM This report has been signed electronically. Number of Addenda: 0 Note Initiated On: 05/29/2023 7:55 AM Scope Withdrawal Time: 0 hours 7 minutes 26 seconds  Total Procedure Duration: 0 hours 16 minutes 54 seconds  Estimated Blood Loss:  Estimated blood loss: none.      [facility]

## 2023-05-29 NOTE — Anesthesia Postprocedure Evaluation (Signed)
Anesthesia Post Note  Patient: Mary Kirk  Procedure(s) Performed: COLONOSCOPY WITH PROPOFOL (Rectum) POLYPECTOMY (Rectum)  Patient location during evaluation: PACU Anesthesia Type: General Level of consciousness: awake and alert Pain management: pain level controlled Vital Signs Assessment: post-procedure vital signs reviewed and stable Respiratory status: spontaneous breathing, nonlabored ventilation, respiratory function stable and patient connected to nasal cannula oxygen Cardiovascular status: blood pressure returned to baseline and stable Postop Assessment: no apparent nausea or vomiting Anesthetic complications: no   No notable events documented.   Last Vitals:  Vitals:   05/29/23 0838 05/29/23 0840  BP:  106/62  Pulse: (!) 51 (!) 50  Resp: 14 13  Temp:  36.5 C  SpO2: 99% 100%    Last Pain:  Vitals:   05/29/23 0840  TempSrc:   PainSc: 0-No pain                 Brieanna Nau C Jontae Adebayo

## 2023-05-30 ENCOUNTER — Encounter: Payer: Self-pay | Admitting: Gastroenterology

## 2023-06-04 ENCOUNTER — Ambulatory Visit
Admission: RE | Admit: 2023-06-04 | Discharge: 2023-06-04 | Disposition: A | Discharge status: Home / Self Care | Payer: BC Managed Care – PPO | Source: Ambulatory Visit | Attending: Family Medicine | Admitting: Family Medicine

## 2023-06-04 ENCOUNTER — Other Ambulatory Visit: Payer: Self-pay

## 2023-06-04 DIAGNOSIS — E2839 Other primary ovarian failure: Secondary | ICD-10-CM | POA: Diagnosis present

## 2023-06-04 DIAGNOSIS — Z1231 Encounter for screening mammogram for malignant neoplasm of breast: Secondary | ICD-10-CM | POA: Diagnosis present

## 2023-06-05 ENCOUNTER — Telehealth: Payer: Self-pay

## 2023-06-05 NOTE — Telephone Encounter (Signed)
This path report was faxed by another office. Please review in epic

## 2023-06-09 NOTE — Telephone Encounter (Signed)
Patient verbalized understanding of results. Put a recall in the system for 5 years

## 2024-05-18 ENCOUNTER — Encounter: Payer: Self-pay | Admitting: Internal Medicine

## 2024-05-18 ENCOUNTER — Inpatient Hospital Stay

## 2024-05-18 ENCOUNTER — Inpatient Hospital Stay: Attending: Internal Medicine | Admitting: Internal Medicine

## 2024-05-18 VITALS — BP 118/88 | HR 64 | Temp 97.7°F | Resp 18 | Ht 64.0 in | Wt 103.0 lb

## 2024-05-18 DIAGNOSIS — D75839 Thrombocytosis, unspecified: Secondary | ICD-10-CM

## 2024-05-18 DIAGNOSIS — I4891 Unspecified atrial fibrillation: Secondary | ICD-10-CM | POA: Diagnosis not present

## 2024-05-18 DIAGNOSIS — Z803 Family history of malignant neoplasm of breast: Secondary | ICD-10-CM | POA: Diagnosis not present

## 2024-05-18 DIAGNOSIS — Z7901 Long term (current) use of anticoagulants: Secondary | ICD-10-CM | POA: Diagnosis not present

## 2024-05-18 LAB — COMPREHENSIVE METABOLIC PANEL WITH GFR
ALT: 40 U/L (ref 0–44)
AST: 36 U/L (ref 15–41)
Albumin: 4.3 g/dL (ref 3.5–5.0)
Alkaline Phosphatase: 56 U/L (ref 38–126)
Anion gap: 9 (ref 5–15)
BUN: 16 mg/dL (ref 8–23)
CO2: 26 mmol/L (ref 22–32)
Calcium: 8.9 mg/dL (ref 8.9–10.3)
Chloride: 95 mmol/L — ABNORMAL LOW (ref 98–111)
Creatinine, Ser: 0.78 mg/dL (ref 0.44–1.00)
GFR, Estimated: >60 mL/min (ref >60)
Glucose, Bld: 86 mg/dL (ref 70–99)
Potassium: 4.9 mmol/L (ref 3.5–5.1)
Sodium: 130 mmol/L — ABNORMAL LOW (ref 135–145)
Total Bilirubin: 0.9 mg/dL (ref 0.0–1.2)
Total Protein: 7.4 g/dL (ref 6.5–8.1)

## 2024-05-18 LAB — CBC WITH DIFFERENTIAL/PLATELET
Abs Immature Granulocytes: 0.02 10*3/uL (ref 0.00–0.07)
Basophils Absolute: 0.1 10*3/uL (ref 0.0–0.1)
Basophils Relative: 1 %
Eosinophils Absolute: 0.2 10*3/uL (ref 0.0–0.5)
Eosinophils Relative: 3 %
HCT: 42.5 % (ref 36.0–46.0)
Hemoglobin: 14.2 g/dL (ref 12.0–15.0)
Immature Granulocytes: 0 %
Lymphocytes Relative: 18 %
Lymphs Abs: 1.2 10*3/uL (ref 0.7–4.0)
MCH: 30.2 pg (ref 26.0–34.0)
MCHC: 33.4 g/dL (ref 30.0–36.0)
MCV: 90.4 fL (ref 80.0–100.0)
Monocytes Absolute: 0.7 10*3/uL (ref 0.1–1.0)
Monocytes Relative: 10 %
Neutro Abs: 4.6 10*3/uL (ref 1.7–7.7)
Neutrophils Relative %: 68 %
Platelets: 480 10*3/uL — ABNORMAL HIGH (ref 150–400)
RBC: 4.7 MIL/uL (ref 3.87–5.11)
RDW: 13.3 % (ref 11.5–15.5)
WBC: 6.8 10*3/uL (ref 4.0–10.5)
nRBC: 0 % (ref 0.0–0.2)

## 2024-05-18 LAB — LACTATE DEHYDROGENASE: LDH: 114 U/L (ref 98–192)

## 2024-05-18 NOTE — Progress Notes (Signed)
 Fatigue: Headaches:  Joint pain: Bleeding from gums/nose:

## 2024-05-18 NOTE — Progress Notes (Signed)
 [facility] CONSULT NOTE  Patient Care Team: Valere Gata, MD as PCP - General (Family Medicine) Gwyn Leos, MD as Consulting Physician (Oncology) Gwyn Leos, MD as Consulting Physician (Hematology and Oncology)  CHIEF COMPLAINTS/PURPOSE OF CONSULTATION: Thrombocytosis.   HEMATOLOGY HISTORY  # THROMBOCYTOSIS [platelets- ; Hb; white count]; CT/US -    HISTORY OF PRESENTING ILLNESS: Patient ambulating-independently. alone   Mary Kirk 68 y.o.  female pleasant patient with prior history of elevated platelets, and recent diagnosis of A-fib in April 2025 was been referred to us  for further evaluation of elevated platelets.  Patient April 2025 diagnosed with A-fib when she presented to PCP for worsening fatigue/palpitations.  And also noted to have elevated platelets around 1 million at the time.  Patient was admitted to the hospital at [facility] on Eliquis.  Patient of note patient had prior dx of thrombocytosis- >  7-8 years ago.  Did not have any bone marrow biopsy.  Patient was lost to follow-up.  Patient denies any history of blood clots or strokes. Denies any burning pain or discoloration in the fingertips or toes.   Appetite is good . No weight loss or night sweats no recurrent fevers. No cough or shortness of breath or chest pain.   Causes: Medications/steroids-alcohol/splenectomy/  smoking: none  Review of Systems  Constitutional:  Negative for chills, diaphoresis, fever, malaise/fatigue and weight loss.  HENT:  Negative for nosebleeds and sore throat.   Eyes:  Negative for double vision.  Respiratory:  Negative for cough, hemoptysis, sputum production, shortness of breath and wheezing.   Cardiovascular:  Negative for chest pain, palpitations, orthopnea and leg swelling.  Gastrointestinal:  Negative for abdominal pain, blood in stool, constipation, diarrhea, heartburn, melena, nausea and vomiting.  Genitourinary:  Negative for  dysuria, frequency and urgency.  Musculoskeletal:  Negative for back pain and joint pain.  Skin: Negative.  Negative for itching and rash.  Neurological:  Negative for dizziness, tingling, focal weakness, weakness and headaches.  Endo/Heme/Allergies:  Does not bruise/bleed easily.  Psychiatric/Behavioral:  Negative for depression. The patient is not nervous/anxious and does not have insomnia.      MEDICAL HISTORY:  Past Medical History:  Diagnosis Date   Anxiety    Arthritis    GERD (gastroesophageal reflux disease)    IBS (irritable bowel syndrome)    Constipation predominant   Osteoporosis    PONV (postoperative nausea and vomiting)     SURGICAL HISTORY: Past Surgical History:  Procedure Laterality Date   BUNIONECTOMY Bilateral    COLONOSCOPY WITH PROPOFOL  N/A 05/22/2018   Procedure: COLONOSCOPY WITH PROPOFOL ;  Surgeon: Marnee Sink, MD;  Location: [facility];  Service: Endoscopy;  Laterality: N/A;   COLONOSCOPY WITH PROPOFOL  N/A 05/29/2023   Procedure: COLONOSCOPY WITH PROPOFOL ;  Surgeon: Marnee Sink, MD;  Location: [facility];  Service: Endoscopy;  Laterality: N/A;   POLYPECTOMY  05/29/2023   Procedure: POLYPECTOMY;  Surgeon: Marnee Sink, MD;  Location: [facility];  Service: Endoscopy;;    SOCIAL HISTORY: Social History   Socioeconomic History   Marital status: Married    Spouse name: Not on file   Number of children: 1   Years of education: Not on file   Highest education level: Not on file  Occupational History   Occupation: unemployed    Employer: [employer]    Comment: previous bank work  Tobacco Use   Smoking status: Never   Smokeless tobacco: Never  Substance and Sexual Activity  Alcohol use: Yes    Alcohol/week: 10.0 standard drinks of alcohol    Types: 10 Glasses of wine per week    Comment: wine   Drug use: No   Sexual activity: Yes  Other Topics Concern   Not on file  Social History Narrative   Marital  status: married x 42 years.     Children: 1 son; 2 grandchildren      Lives: with husband      Employment: unemployed; previous bank work.      Tobacco; never      Alcohol: 1-2 glasses of wine per night      Exercise: jogging and walking in 2018      ADLs: independent   Social Drivers of Health   Financial Resource Strain: Low Risk  (05/09/2024)   Received from [facility]   Overall Financial Resource Strain (CARDIA)    Difficulty of Paying Living Expenses: Not hard at all  Food Insecurity: No Food Insecurity (05/09/2024)   Received from [facility]   Hunger Vital Sign    Worried About Running Out of Food in the Last Year: Never true    Ran Out of Food in the Last Year: Never true  Transportation Needs: No Transportation Needs (05/09/2024)   Received from [facility] - Transportation    In the past 12 months, has lack of transportation kept you from medical appointments or from getting medications?: No    Lack of Transportation (Non-Medical): No  Physical Activity: Sufficiently Active (02/11/2018)   Exercise Vital Sign    Days of Exercise per Week: 4 days    Minutes of Exercise per Session: 150+ min  Stress: Not on file  Social Connections: Not on file  Intimate Partner Violence: Not At Risk (05/18/2024)   Humiliation, Afraid, Rape, and Kick questionnaire    Fear of Current or Ex-Partner: No    Emotionally Abused: No    Physically Abused: No    Sexually Abused: No    FAMILY HISTORY: Family History  Problem Relation Age of Onset   Diabetes Mother    Heart disease Mother    Arthritis Father    Hyperlipidemia Father    Hypertension Father    Stroke Father 75       CVA at age 49; recurrent CVA age 26.   Dementia Father    Breast cancer Maternal Grandmother 21    ALLERGIES:  is allergic to codeine.  MEDICATIONS:  Current Outpatient Medications  Medication Sig Dispense Refill   acetaminophen (TYLENOL) 325 MG  tablet Take 650 mg by mouth every 6 (six) hours as needed.     apixaban (ELIQUIS) 5 MG TABS tablet Take 5 mg by mouth.     Calcium Carb-Cholecalciferol (CALCIUM 600 + D PO) Take 600 mg by mouth 2 (two) times daily.     escitalopram (LEXAPRO) 10 MG tablet Take 10 mg by mouth daily.     ibandronate (BONIVA) 150 MG tablet Take 150 mg by mouth every 30 (thirty) days. Take in the morning with a full glass of water , on an empty stomach, and do not take anything else by mouth or lie down for the next 30 min.     Magnesium Citrate 125 MG CAPS Take 1 capsule by mouth at bedtime.     metoprolol succinate (TOPROL-XL) 25 MG 24 hr tablet Take 12.5 mg by mouth.     metoprolol tartrate (LOPRESSOR) 25 MG tablet Take 25  mg by mouth.     pantoprazole  (PROTONIX ) 40 MG tablet Take 40 mg by mouth in the morning and at bedtime.     polyethylene glycol powder (GLYCOLAX/MIRALAX) 17 GM/SCOOP powder Take 1 Container by mouth once.     No current facility-administered medications for this visit.     PHYSICAL EXAMINATION:   Vitals:   05/18/24 1048  BP: 118/88  Pulse: 64  Resp: 18  Temp: 97.7 F (36.5 C)  SpO2: 100%   Filed Weights   05/18/24 1048  Weight: 103 lb (46.7 kg)    Physical Exam Vitals and nursing note reviewed.  HENT:     Head: Normocephalic and atraumatic.     Mouth/Throat:     Pharynx: Oropharynx is clear.  Eyes:     Extraocular Movements: Extraocular movements intact.     Pupils: Pupils are equal, round, and reactive to light.  Cardiovascular:     Rate and Rhythm: Normal rate and regular rhythm.  Pulmonary:     Comments: Decreased breath sounds bilaterally.  Abdominal:     Palpations: Abdomen is soft.  Musculoskeletal:        General: Normal range of motion.     Cervical back: Normal range of motion.  Skin:    General: Skin is warm.  Neurological:     General: No focal deficit present.     Mental Status: She is alert and oriented to person, place, and time.  Psychiatric:         Behavior: Behavior normal.        Judgment: Judgment normal.      LABORATORY DATA:  I have reviewed the data as listed Lab Results  Component Value Date   WBC 10.7 (H) 03/02/2019   HGB 15.2 (H) 03/02/2019   HCT 45.9 03/02/2019   MCV 94.3 03/02/2019   PLT 534 (H) 03/02/2019   No results for input(s): "NA", "K", "CL", "CO2", "GLUCOSE", "BUN", "CREATININE", "CALCIUM", "GFRNONAA", "GFRAA", "PROT", "ALBUMIN", "AST", "ALT", "ALKPHOS", "BILITOT", "BILIDIR", "IBILI" in the last 8760 hours.   No results found.  ASSESSMENT & PLAN:   Thrombocytosis # Chronic thrombocytosis [400-500 range-> 6 to 8 years; ? Dr. Dudley Ghee -question essential thrombocytosis versus reactive [clinically less likely]. Patient is asymptomatic.  Long discussion with the patient regarding potential cause of the abnormal blood counts. # I discussed the potential concerns for stroke with elevated platelets-however it is currently mitigated as patient is on Eliquis.   For now I  recommend checking CBC;CMP jak 2 BCR ABL; MPL; CALR mutation on the peripheral blood.LDH;  Check peripheral smear. Also discussed regarding bone marrow biopsy with the above workup is inconclusive; however I would prefer not to do a bone marrow unless absolutely needed.  I discussed the possibility of essential thrombocytosis-and the need for Hydrea if diagnosed with ET.  For now await above workup.   # A.fib- [on eliquis- APRIl 2025 - awaiting cardiac evaluation- ? [facility].   Thank you Dr. Felipe Horton or allowing me to participate in the care of your pleasant patient. Please do not hesitate to contact me with questions or concerns in the interim.  # Patient follow-up with me in approximately 2-3 weeks to review the above results. All questions were answered. The patient knows to call the clinic with any problems, questions or concerns.  # DISPOSITION: # labs today- ordered- CBC;CMP jak 2 BCR ABL; MPL; CALR mutation on the peripheral blood.LDH- # follow  up in 2-3 weeks- MD; no labs- Dr.B  Gwyn Leos, MD 05/18/2024 11:50 AM

## 2024-05-18 NOTE — Assessment & Plan Note (Addendum)
#   Chronic thrombocytosis [400-500 range-> 6 to 8 years; ? Dr. Dudley Ghee -question essential thrombocytosis versus reactive [clinically less likely]. Patient is asymptomatic.  Long discussion with the patient regarding potential cause of the abnormal blood counts. # I discussed the potential concerns for stroke with elevated platelets-however it is currently mitigated as patient is on Eliquis.   For now I  recommend checking CBC;CMP jak 2 BCR ABL; MPL; CALR mutation on the peripheral blood.LDH;  Check peripheral smear. Also discussed regarding bone marrow biopsy with the above workup is inconclusive; however I would prefer not to do a bone marrow unless absolutely needed.  I discussed the possibility of essential thrombocytosis-and the need for Hydrea if diagnosed with ET.  For now await above workup.   # A.fib- [on eliquis- APRIl 2025 - awaiting cardiac evaluation- ? [facility].   Thank you Dr. Felipe Horton or allowing me to participate in the care of your pleasant patient. Please do not hesitate to contact me with questions or concerns in the interim.  # Patient follow-up with me in approximately 2-3 weeks to review the above results. All questions were answered. The patient knows to call the clinic with any problems, questions or concerns.  # DISPOSITION: # labs today- ordered- CBC;CMP jak 2 BCR ABL; MPL; CALR mutation on the peripheral blood.LDH- # follow up in 2-3 weeks- MD; no labs- Dr.B

## 2024-05-18 NOTE — Progress Notes (Signed)
 Patient was recently diagnosed with A-Fib, June 18 th will have her first visit with her Cardiologist.

## 2024-05-21 LAB — BCR-ABL1 FISH
Cells Analyzed: 200
Cells Counted: 200

## 2024-05-24 LAB — JAK2 GENOTYPR

## 2024-05-24 LAB — CALRETICULIN (CALR) MUTATION ANALYSIS

## 2024-05-25 ENCOUNTER — Other Ambulatory Visit: Payer: Self-pay | Admitting: Family Medicine

## 2024-05-25 DIAGNOSIS — Z1231 Encounter for screening mammogram for malignant neoplasm of breast: Secondary | ICD-10-CM

## 2024-05-27 LAB — MPL MUTATION ANALYSIS

## 2024-06-07 ENCOUNTER — Ambulatory Visit: Admitting: Internal Medicine

## 2024-06-14 ENCOUNTER — Inpatient Hospital Stay (HOSPITAL_BASED_OUTPATIENT_CLINIC_OR_DEPARTMENT_OTHER): Admitting: Internal Medicine

## 2024-06-14 ENCOUNTER — Encounter: Payer: Self-pay | Admitting: Internal Medicine

## 2024-06-14 VITALS — BP 111/70 | HR 62 | Temp 98.9°F | Resp 12 | Ht 64.0 in | Wt 101.8 lb

## 2024-06-14 DIAGNOSIS — D75839 Thrombocytosis, unspecified: Secondary | ICD-10-CM | POA: Diagnosis not present

## 2024-06-14 NOTE — Progress Notes (Signed)
 [facility] CONSULT NOTE  Patient Care Team: Claudene Rayfield HERO, MD as PCP - General (Family Medicine) Rennie Cindy SAUNDERS, MD as Consulting Physician (Oncology) Rennie Cindy SAUNDERS, MD as Consulting Physician (Hematology and Oncology)  CHIEF COMPLAINTS/PURPOSE OF CONSULTATION: Thrombocytosis.   HEMATOLOGY HISTORY  # THROMBOCYTOSIS [platelets- ; Hb; white count]; CT/US -    HISTORY OF PRESENTING ILLNESS: Patient ambulating-independently. alone   Mary Kirk 68 y.o.  female pleasant patient with prior history of elevated platelets, and recent diagnosis of A-fib in April 2025-on Eliquis recently reviewed the blood work ordered for elevated platelets.   Patient denies any history of blood clots or strokes. Denies any burning pain or discoloration in the fingertips or toes.   Appetite is good . No weight loss or night sweats no recurrent fevers. No cough or shortness of breath or chest pain.   Review of Systems  Constitutional:  Negative for chills, diaphoresis, fever, malaise/fatigue and weight loss.  HENT:  Negative for nosebleeds and sore throat.   Eyes:  Negative for double vision.  Respiratory:  Negative for cough, hemoptysis, sputum production, shortness of breath and wheezing.   Cardiovascular:  Negative for chest pain, palpitations, orthopnea and leg swelling.  Gastrointestinal:  Negative for abdominal pain, blood in stool, constipation, diarrhea, heartburn, melena, nausea and vomiting.  Genitourinary:  Negative for dysuria, frequency and urgency.  Musculoskeletal:  Negative for back pain and joint pain.  Skin: Negative.  Negative for itching and rash.  Neurological:  Negative for dizziness, tingling, focal weakness, weakness and headaches.  Endo/Heme/Allergies:  Does not bruise/bleed easily.  Psychiatric/Behavioral:  Negative for depression. The patient is not nervous/anxious and does not have insomnia.      MEDICAL HISTORY:  Past Medical History:   Diagnosis Date   Anxiety    Arthritis    GERD (gastroesophageal reflux disease)    IBS (irritable bowel syndrome)    Constipation predominant   Osteoporosis    PONV (postoperative nausea and vomiting)     SURGICAL HISTORY: Past Surgical History:  Procedure Laterality Date   BUNIONECTOMY Bilateral    COLONOSCOPY WITH PROPOFOL  N/A 05/22/2018   Procedure: COLONOSCOPY WITH PROPOFOL ;  Surgeon: Jinny Carmine, MD;  Location: [facility];  Service: Endoscopy;  Laterality: N/A;   COLONOSCOPY WITH PROPOFOL  N/A 05/29/2023   Procedure: COLONOSCOPY WITH PROPOFOL ;  Surgeon: Jinny Carmine, MD;  Location: [facility];  Service: Endoscopy;  Laterality: N/A;   POLYPECTOMY  05/29/2023   Procedure: POLYPECTOMY;  Surgeon: Jinny Carmine, MD;  Location: [facility];  Service: Endoscopy;;    SOCIAL HISTORY: Social History   Socioeconomic History   Marital status: Married    Spouse name: Not on file   Number of children: 1   Years of education: Not on file   Highest education level: Not on file  Occupational History   Occupation: unemployed    Employer: [employer]    Comment: previous bank work  Tobacco Use   Smoking status: Never   Smokeless tobacco: Never  Substance and Sexual Activity   Alcohol use: Yes    Alcohol/week: 10.0 standard drinks of alcohol    Types: 10 Glasses of wine per week    Comment: wine   Drug use: No   Sexual activity: Yes  Other Topics Concern   Not on file  Social History Narrative   Marital status: married x 42 years.     Children: 1 son; 2 grandchildren      Lives: with  husband      Employment: unemployed; previous bank work.      Tobacco; never      Alcohol: 1-2 glasses of wine per night      Exercise: jogging and walking in 2018      ADLs: independent   Social Drivers of Health   Financial Resource Strain: Low Risk  (06/04/2024)   Received from [facility]   Overall Financial Resource Strain (CARDIA)     Difficulty of Paying Living Expenses: Not hard at all  Food Insecurity: No Food Insecurity (06/04/2024)   Received from [facility]   Hunger Vital Sign    Within the past 12 months, you worried that your food would run out before you got the money to buy more.: Never true    Within the past 12 months, the food you bought just didn't last and you didn't have money to get more.: Never true  Transportation Needs: No Transportation Needs (06/04/2024)   Received from [facility] - Transportation    In the past 12 months, has lack of transportation kept you from medical appointments or from getting medications?: No    Lack of Transportation (Non-Medical): No  Physical Activity: Sufficiently Active (02/11/2018)   Exercise Vital Sign    Days of Exercise per Week: 4 days    Minutes of Exercise per Session: 150+ min  Stress: Not on file  Social Connections: Not on file  Intimate Partner Violence: Not At Risk (05/18/2024)   Humiliation, Afraid, Rape, and Kick questionnaire    Fear of Current or Ex-Partner: No    Emotionally Abused: No    Physically Abused: No    Sexually Abused: No    FAMILY HISTORY: Family History  Problem Relation Age of Onset   Diabetes Mother    Heart disease Mother    Arthritis Father    Hyperlipidemia Father    Hypertension Father    Stroke Father 59       CVA at age 34; recurrent CVA age 28.   Dementia Father    Breast cancer Maternal Grandmother 79    ALLERGIES:  is allergic to codeine.  MEDICATIONS:  Current Outpatient Medications  Medication Sig Dispense Refill   acetaminophen (TYLENOL) 325 MG tablet Take 650 mg by mouth every 6 (six) hours as needed.     apixaban (ELIQUIS) 5 MG TABS tablet Take 5 mg by mouth.     Calcium Carb-Cholecalciferol (CALCIUM 600 + D PO) Take 600 mg by mouth 2 (two) times daily.     escitalopram (LEXAPRO) 10 MG tablet Take 10 mg by mouth daily.     ibandronate (BONIVA) 150 MG tablet Take  150 mg by mouth every 30 (thirty) days. Take in the morning with a full glass of water , on an empty stomach, and do not take anything else by mouth or lie down for the next 30 min.     Magnesium Citrate 125 MG CAPS Take 1 capsule by mouth at bedtime.     metoprolol succinate (TOPROL-XL) 25 MG 24 hr tablet Take 12.5 mg by mouth daily.     metoprolol tartrate (LOPRESSOR) 25 MG tablet Take 25 mg by mouth daily as needed.     pantoprazole  (PROTONIX ) 40 MG tablet Take 40 mg by mouth in the morning and at bedtime.     polyethylene glycol powder (GLYCOLAX/MIRALAX) 17 GM/SCOOP powder Take 1 Container by mouth once.     No current facility-administered  medications for this visit.     PHYSICAL EXAMINATION:   Vitals:   06/14/24 1338  BP: 111/70  Pulse: 62  Resp: 12  Temp: 98.9 F (37.2 C)  SpO2: 100%    Filed Weights   06/14/24 1338  Weight: 101 lb 12.8 oz (46.2 kg)     Physical Exam Vitals and nursing note reviewed.  HENT:     Head: Normocephalic and atraumatic.     Mouth/Throat:     Pharynx: Oropharynx is clear.   Eyes:     Extraocular Movements: Extraocular movements intact.     Pupils: Pupils are equal, round, and reactive to light.    Cardiovascular:     Rate and Rhythm: Normal rate and regular rhythm.  Pulmonary:     Comments: Decreased breath sounds bilaterally.  Abdominal:     Palpations: Abdomen is soft.   Musculoskeletal:        General: Normal range of motion.     Cervical back: Normal range of motion.   Skin:    General: Skin is warm.   Neurological:     General: No focal deficit present.     Mental Status: She is alert and oriented to person, place, and time.   Psychiatric:        Behavior: Behavior normal.        Judgment: Judgment normal.      LABORATORY DATA:  I have reviewed the data as listed Lab Results  Component Value Date   WBC 6.8 05/18/2024   HGB 14.2 05/18/2024   HCT 42.5 05/18/2024   MCV 90.4 05/18/2024   PLT 480 (H) 05/18/2024    Recent Labs    05/18/24 1143  NA 130*  K 4.9  CL 95*  CO2 26  GLUCOSE 86  BUN 16  CREATININE 0.78  CALCIUM 8.9  GFRNONAA >60  PROT 7.4  ALBUMIN 4.3  AST 36  ALT 40  ALKPHOS 56  BILITOT 0.9     No results found.  ASSESSMENT & PLAN:   Thrombocytosis # Chronic thrombocytosis [400-500 range-> 6 to 8 years; ? Dr. Lavina clinically suggestive of MPN/ essential thrombocytosis; less likely reactive causes.  June 2025-MPN workup-JAK2 mutation MPL CALR mutation negative; BCR-ABL-NEGATIVE.   I discussed the role of a bone marrow biopsy. However given the mildly elevated platelets] patient is already on Eliquis for A-fib I think it is reasonable to hold off bone marrow biopsy at this time. Will consider bone marrow if platelets >500- and possible need to start hydrea.   # A.fib- [on eliquis- APRIl 2025 elevated platelets around 1 million] -Dr. Britta; awaiting currently awaiting 14-day Holter monitor. If negative for A.fib- then recommend baby asprin. Pt will call for recommendations.   # DISPOSITION: # follow up in 6 month/Jan mid - MD;labs- cbc/cmp; [facility]- Dr.B      Cindy JONELLE Joe, MD 06/14/2024 2:42 PM

## 2024-06-14 NOTE — Progress Notes (Signed)
 Fatigue: NO Headaches: NO Joint pain: NO Bleeding from gums/nose:  NO   Dx with afib in May 2025. Sees Dr. Britta at [facility].

## 2024-06-14 NOTE — Assessment & Plan Note (Addendum)
#   Chronic thrombocytosis [400-500 range-> 6 to 8 years; ? Dr. Lavina clinically suggestive of MPN/ essential thrombocytosis; less likely reactive causes.  June 2025-MPN workup-JAK2 mutation MPL CALR mutation negative; BCR-ABL-NEGATIVE.   I discussed the role of a bone marrow biopsy. However given the mildly elevated platelets] patient is already on Eliquis for A-fib I think it is reasonable to hold off bone marrow biopsy at this time. Will consider bone marrow if platelets >500- and possible need to start hydrea.   # A.fib- [on eliquis- APRIl 2025 elevated platelets around 1 million] -Dr. Britta; awaiting currently awaiting 14-day Holter monitor. If negative for A.fib- then recommend baby asprin. Pt will call for recommendations.   # DISPOSITION: # follow up in 6 month/Jan mid - MD;labs- cbc/cmp; [facility]- Dr.B

## 2024-12-29 ENCOUNTER — Inpatient Hospital Stay: Attending: Internal Medicine

## 2024-12-29 ENCOUNTER — Inpatient Hospital Stay: Admitting: Internal Medicine

## 2024-12-29 ENCOUNTER — Encounter: Payer: Self-pay | Admitting: Internal Medicine

## 2024-12-29 VITALS — BP 109/72 | HR 63 | Temp 98.6°F | Resp 12 | Ht 64.0 in | Wt 105.6 lb

## 2024-12-29 DIAGNOSIS — D75839 Thrombocytosis, unspecified: Secondary | ICD-10-CM

## 2024-12-29 DIAGNOSIS — Z79899 Other long term (current) drug therapy: Secondary | ICD-10-CM | POA: Diagnosis not present

## 2024-12-29 DIAGNOSIS — M810 Age-related osteoporosis without current pathological fracture: Secondary | ICD-10-CM | POA: Diagnosis not present

## 2024-12-29 DIAGNOSIS — Z7901 Long term (current) use of anticoagulants: Secondary | ICD-10-CM | POA: Insufficient documentation

## 2024-12-29 DIAGNOSIS — I480 Paroxysmal atrial fibrillation: Secondary | ICD-10-CM | POA: Diagnosis not present

## 2024-12-29 DIAGNOSIS — Z803 Family history of malignant neoplasm of breast: Secondary | ICD-10-CM | POA: Diagnosis not present

## 2024-12-29 LAB — CBC WITH DIFFERENTIAL (CANCER CENTER ONLY)
Abs Immature Granulocytes: 0.04 K/uL (ref 0.00–0.07)
Basophils Absolute: 0.1 K/uL (ref 0.0–0.1)
Basophils Relative: 1 %
Eosinophils Absolute: 0.2 K/uL (ref 0.0–0.5)
Eosinophils Relative: 2 %
HCT: 42.3 % (ref 36.0–46.0)
Hemoglobin: 14.2 g/dL (ref 12.0–15.0)
Immature Granulocytes: 1 %
Lymphocytes Relative: 18 %
Lymphs Abs: 1.4 K/uL (ref 0.7–4.0)
MCH: 31.2 pg (ref 26.0–34.0)
MCHC: 33.6 g/dL (ref 30.0–36.0)
MCV: 93 fL (ref 80.0–100.0)
Monocytes Absolute: 1 K/uL (ref 0.1–1.0)
Monocytes Relative: 12 %
Neutro Abs: 5.4 K/uL (ref 1.7–7.7)
Neutrophils Relative %: 66 %
Platelet Count: 469 K/uL — ABNORMAL HIGH (ref 150–400)
RBC: 4.55 MIL/uL (ref 3.87–5.11)
RDW: 13.1 % (ref 11.5–15.5)
WBC Count: 8.2 K/uL (ref 4.0–10.5)
nRBC: 0 % (ref 0.0–0.2)

## 2024-12-29 LAB — CMP (CANCER CENTER ONLY)
ALT: 43 U/L (ref 0–44)
AST: 40 U/L (ref 15–41)
Albumin: 4.7 g/dL (ref 3.5–5.0)
Alkaline Phosphatase: 68 U/L (ref 38–126)
Anion gap: 9 (ref 5–15)
BUN: 14 mg/dL (ref 8–23)
CO2: 28 mmol/L (ref 22–32)
Calcium: 9.8 mg/dL (ref 8.9–10.3)
Chloride: 96 mmol/L — ABNORMAL LOW (ref 98–111)
Creatinine: 0.85 mg/dL (ref 0.44–1.00)
GFR, Estimated: >60 mL/min
Glucose, Bld: 93 mg/dL (ref 70–99)
Potassium: 4.8 mmol/L (ref 3.5–5.1)
Sodium: 133 mmol/L — ABNORMAL LOW (ref 135–145)
Total Bilirubin: 0.7 mg/dL (ref 0.0–1.2)
Total Protein: 7.4 g/dL (ref 6.5–8.1)

## 2024-12-29 LAB — LACTATE DEHYDROGENASE: LDH: 166 U/L (ref 105–235)

## 2024-12-29 NOTE — Progress Notes (Signed)
 [facility] CONSULT NOTE  Patient Care Team: Claudene Rayfield HERO, MD as PCP - General (Family Medicine) Rennie Cindy SAUNDERS, MD as Consulting Physician (Oncology)  CHIEF COMPLAINTS/PURPOSE OF CONSULTATION: Thrombocytosis.   HEMATOLOGY HISTORY  # THROMBOCYTOSIS [platelets- ; Hb; white count]; CT/US -    HISTORY OF PRESENTING ILLNESS: Patient ambulating-independently. alone   Mary Kirk 69 y.o.  female pleasant patient with prior history of elevated platelets, and recent diagnosis of A-fib in April 2025-on Eliquis recently reviewed the blood work ordered for elevated platelets.   Discussed the use of AI scribe software for clinical note transcription with the patient, who gave verbal consent to proceed.  History of Present Illness   Mary Kirk is a 69 year old female with chronic thrombocytosis who presents for hematology follow-up to review recent laboratory results and ongoing management.  She has persistent thrombocytosis, with recent platelet counts trending downward from the 500s to 469, the lowest value in a long time. Platelet counts previously peaked at one million during a prior hospitalization for illness. She remains asymptomatic, denying abnormal bleeding, ecchymosis, epistaxis, or gingival bleeding. Hemoglobin and white blood cell counts are within normal limits.  Sodium levels are mildly decreased, with the most recent value at 133 and a prior value of 130. She does not cook with much salt but adds salt at the table. She is currently taking Lexapro, which may contribute to the mild hyponatremia, and is also on antihypertensive medications.  She has paroxysmal atrial fibrillation, with heart monitoring revealing a regular rhythm and only one brief episode of atrial fibrillation. She is not currently in atrial fibrillation and remains on Eliquis for stroke prevention. No bleeding complications have occurred while on anticoagulation.       Review of Systems  Constitutional:  Negative for chills, diaphoresis, fever, malaise/fatigue and weight loss.  HENT:  Negative for nosebleeds and sore throat.   Eyes:  Negative for double vision.  Respiratory:  Negative for cough, hemoptysis, sputum production, shortness of breath and wheezing.   Cardiovascular:  Negative for chest pain, palpitations, orthopnea and leg swelling.  Gastrointestinal:  Negative for abdominal pain, blood in stool, constipation, diarrhea, heartburn, melena, nausea and vomiting.  Genitourinary:  Negative for dysuria, frequency and urgency.  Musculoskeletal:  Negative for back pain and joint pain.  Skin: Negative.  Negative for itching and rash.  Neurological:  Negative for dizziness, tingling, focal weakness, weakness and headaches.  Endo/Heme/Allergies:  Does not bruise/bleed easily.  Psychiatric/Behavioral:  Negative for depression. The patient is not nervous/anxious and does not have insomnia.      MEDICAL HISTORY:  Past Medical History:  Diagnosis Date   Anxiety    Arthritis    GERD (gastroesophageal reflux disease)    IBS (irritable bowel syndrome)    Constipation predominant   Osteoporosis    PONV (postoperative nausea and vomiting)     SURGICAL HISTORY: Past Surgical History:  Procedure Laterality Date   BUNIONECTOMY Bilateral    COLONOSCOPY WITH PROPOFOL  N/A 05/22/2018   Procedure: COLONOSCOPY WITH PROPOFOL ;  Surgeon: Jinny Carmine, MD;  Location: [facility];  Service: Endoscopy;  Laterality: N/A;   COLONOSCOPY WITH PROPOFOL  N/A 05/29/2023   Procedure: COLONOSCOPY WITH PROPOFOL ;  Surgeon: Jinny Carmine, MD;  Location: [facility];  Service: Endoscopy;  Laterality: N/A;   POLYPECTOMY  05/29/2023   Procedure: POLYPECTOMY;  Surgeon: Jinny Carmine, MD;  Location: [facility];  Service: Endoscopy;;    SOCIAL HISTORY: Social History   Socioeconomic History  Marital status: Married    Spouse name: Not on file   Number of  children: 1   Years of education: Not on file   Highest education level: Not on file  Occupational History   Occupation: unemployed    Employer: [employer]    Comment: previous bank work  Tobacco Use   Smoking status: Never   Smokeless tobacco: Never  Substance and Sexual Activity   Alcohol use: Yes    Alcohol/week: 10.0 standard drinks of alcohol    Types: 10 Glasses of wine per week    Comment: wine   Drug use: No   Sexual activity: Yes  Other Topics Concern   Not on file  Social History Narrative   Marital status: married x 42 years.     Children: 1 son; 2 grandchildren      Lives: with husband      Employment: unemployed; previous bank work.      Tobacco; never      Alcohol: 1-2 glasses of wine per night      Exercise: jogging and walking in 2018      ADLs: independent   Social Drivers of Health   Tobacco Use: Low Risk (12/29/2024)   Patient History    Smoking Tobacco Use: Never    Smokeless Tobacco Use: Never    Passive Exposure: Not on file  Financial Resource Strain: Low Risk  (06/04/2024)   Received from [facility]   Overall Financial Resource Strain (CARDIA)    Difficulty of Paying Living Expenses: Not hard at all  Food Insecurity: No Food Insecurity (06/04/2024)   Received from [facility]   Epic    Within the past 12 months, you worried that your food would run out before you got the money to buy more.: Never true    Within the past 12 months, the food you bought just didn't last and you didn't have money to get more.: Never true  Transportation Needs: No Transportation Needs (06/04/2024)   Received from [facility] - Transportation    In the past 12 months, has lack of transportation kept you from medical appointments or from getting medications?: No    Lack of Transportation (Non-Medical): No  Physical Activity: Not on file  Stress: Not on file  Social Connections: Not on file   Intimate Partner Violence: Not At Risk (05/18/2024)   Humiliation, Afraid, Rape, and Kick questionnaire    Fear of Current or Ex-Partner: No    Emotionally Abused: No    Physically Abused: No    Sexually Abused: No  Depression (PHQ2-9): Low Risk (12/29/2024)   Depression (PHQ2-9)    PHQ-2 Score: 0  Alcohol Screen: Not on file  Housing: Low Risk  (06/04/2024)   Received from [facility]   Epic    In the last 12 months, was there a time when you were not able to pay the mortgage or rent on time?: No    In the past 12 months, how many times have you moved where you were living?: 0    At any time in the past 12 months, were you homeless or living in a shelter (including now)?: No  Utilities: Not At Risk (06/04/2024)   Received from [facility]   Epic    In the past 12 months has the electric, gas, oil, or water  company threatened to shut off services in your home?: No  Health Literacy: Not on file    FAMILY HISTORY: Family History  Problem Relation Age of Onset   Diabetes Mother    Heart disease Mother    Arthritis Father    Hyperlipidemia Father    Hypertension Father    Stroke Father 42       CVA at age 45; recurrent CVA age 72.   Dementia Father    Breast cancer Maternal Grandmother 82    ALLERGIES:  is allergic to codeine.  MEDICATIONS:  Current Outpatient Medications  Medication Sig Dispense Refill   acetaminophen (TYLENOL) 325 MG tablet Take 650 mg by mouth every 6 (six) hours as needed.     apixaban (ELIQUIS) 5 MG TABS tablet Take 5 mg by mouth 2 (two) times daily.     Calcium Carb-Cholecalciferol (CALCIUM 600 + D PO) Take 600 mg by mouth 2 (two) times daily.     escitalopram (LEXAPRO) 10 MG tablet Take 10 mg by mouth daily.     Magnesium Citrate 125 MG CAPS Take 1 capsule by mouth at bedtime.     metoprolol succinate (TOPROL-XL) 25 MG 24 hr tablet Take 12.5 mg by mouth daily.     metoprolol tartrate (LOPRESSOR) 25 MG tablet Take 25  mg by mouth daily as needed.     pantoprazole  (PROTONIX ) 40 MG tablet Take 20 mg by mouth 2 (two) times daily.     polyethylene glycol powder (GLYCOLAX/MIRALAX) 17 GM/SCOOP powder Take 1 Container by mouth once.     No current facility-administered medications for this visit.     PHYSICAL EXAMINATION:   Vitals:   12/29/24 1011  BP: 109/72  Pulse: 63  Resp: 12  Temp: 98.6 F (37 C)  SpO2: 100%    Filed Weights   12/29/24 1011  Weight: 105 lb 9.6 oz (47.9 kg)     Physical Exam Vitals and nursing note reviewed.  HENT:     Head: Normocephalic and atraumatic.     Mouth/Throat:     Pharynx: Oropharynx is clear.  Eyes:     Extraocular Movements: Extraocular movements intact.     Pupils: Pupils are equal, round, and reactive to light.  Cardiovascular:     Rate and Rhythm: Normal rate and regular rhythm.  Pulmonary:     Comments: Decreased breath sounds bilaterally.  Abdominal:     Palpations: Abdomen is soft.  Musculoskeletal:        General: Normal range of motion.     Cervical back: Normal range of motion.  Skin:    General: Skin is warm.  Neurological:     General: No focal deficit present.     Mental Status: She is alert and oriented to person, place, and time.  Psychiatric:        Behavior: Behavior normal.        Judgment: Judgment normal.      LABORATORY DATA:  I have reviewed the data as listed Lab Results  Component Value Date   WBC 8.2 12/29/2024   HGB 14.2 12/29/2024   HCT 42.3 12/29/2024   MCV 93.0 12/29/2024   PLT 469 (H) 12/29/2024   Recent Labs    05/18/24 1143 12/29/24 1008  NA 130* 133*  K 4.9 4.8  CL 95* 96*  CO2 26 28  GLUCOSE 86 93  BUN 16 14  CREATININE 0.78 0.85  CALCIUM 8.9 9.8  GFRNONAA >60 >60  PROT 7.4 7.4  ALBUMIN 4.3 4.7  AST 36 40  ALT 40 43  ALKPHOS 56 68  BILITOT 0.9 0.7     No results found.  ASSESSMENT & PLAN:   Thrombocytosis # Chronic thrombocytosis [400-500 range-> 6 to 8 years; ? Dr. Lavina  clinically suggestive of MPN/ essential thrombocytosis; less likely reactive causes.  June 2025-MPN workup-JAK2 mutation MPL CALR mutation negative; BCR-ABL-NEGATIVE.   I discussed the role of a bone marrow biopsy. However given the mildly elevated platelets- 468] patient is already on Eliquis for A-fib I think it is reasonable to hold off bone marrow biopsy at this time. Will consider bone marrow if platelets >500- and possible need to start hydrea.   # paroxsymal A.fib- [on eliquis- APRIl 2025 elevated platelets around 1 million] -Dr. Quinn Holter monitor- continue Eliquis.   # DISPOSITION: # follow up in 12  months - MD;labs- cbc/cmp; [facility]- Dr.B       Cindy JONELLE Joe, MD 12/29/2024 11:09 AM

## 2024-12-29 NOTE — Assessment & Plan Note (Addendum)
#   Chronic thrombocytosis [400-500 range-> 6 to 8 years; ? Dr. Lavina clinically suggestive of MPN/ essential thrombocytosis; less likely reactive causes.  June 2025-MPN workup-JAK2 mutation MPL CALR mutation negative; BCR-ABL-NEGATIVE.   I discussed the role of a bone marrow biopsy. However given the mildly elevated platelets- 468] patient is already on Eliquis for A-fib I think it is reasonable to hold off bone marrow biopsy at this time. Will consider bone marrow if platelets >500- and possible need to start hydrea.   # paroxsymal A.fib- [on eliquis- APRIl 2025 elevated platelets around 1 million] -Dr. Quinn Holter monitor- continue Eliquis.   # DISPOSITION: # follow up in 12  months - MD;labs- cbc/cmp; [facility]- Dr.B

## 2024-12-29 NOTE — Progress Notes (Signed)
 Fatigue: NO  Headaches: NO Joint pain: HANDS Bleeding from gums/nose: NO

## 2025-01-10 ENCOUNTER — Encounter

## 2025-01-14 ENCOUNTER — Other Ambulatory Visit: Payer: Self-pay | Admitting: "Endocrinology

## 2025-01-14 DIAGNOSIS — M810 Age-related osteoporosis without current pathological fracture: Secondary | ICD-10-CM

## 2025-01-18 ENCOUNTER — Ambulatory Visit
Admission: RE | Admit: 2025-01-18 | Discharge: 2025-01-18 | Disposition: A | Source: Ambulatory Visit | Attending: Family Medicine | Admitting: Family Medicine

## 2025-01-18 DIAGNOSIS — Z1231 Encounter for screening mammogram for malignant neoplasm of breast: Secondary | ICD-10-CM

## 2025-12-29 ENCOUNTER — Inpatient Hospital Stay: Admitting: Internal Medicine

## 2025-12-29 ENCOUNTER — Inpatient Hospital Stay
